# Patient Record
Sex: Male | Born: 1935 | Race: White | Hispanic: No | Smoking: Former smoker
Health system: Southern US, Community
[De-identification: ages and names within clinical notes are randomized; demographics above are authoritative.]

## PROBLEM LIST (undated history)

## (undated) DIAGNOSIS — H409 Unspecified glaucoma: Secondary | ICD-10-CM

## (undated) DIAGNOSIS — K5730 Diverticulosis of large intestine without perforation or abscess without bleeding: Secondary | ICD-10-CM

## (undated) DIAGNOSIS — H269 Unspecified cataract: Secondary | ICD-10-CM

## (undated) DIAGNOSIS — Z87438 Personal history of other diseases of male genital organs: Secondary | ICD-10-CM

## (undated) DIAGNOSIS — N400 Enlarged prostate without lower urinary tract symptoms: Secondary | ICD-10-CM

## (undated) DIAGNOSIS — M109 Gout, unspecified: Secondary | ICD-10-CM

## (undated) DIAGNOSIS — N289 Disorder of kidney and ureter, unspecified: Secondary | ICD-10-CM

## (undated) DIAGNOSIS — B029 Zoster without complications: Secondary | ICD-10-CM

## (undated) DIAGNOSIS — F329 Major depressive disorder, single episode, unspecified: Secondary | ICD-10-CM

## (undated) DIAGNOSIS — D649 Anemia, unspecified: Secondary | ICD-10-CM

## (undated) DIAGNOSIS — E039 Hypothyroidism, unspecified: Secondary | ICD-10-CM

## (undated) DIAGNOSIS — L409 Psoriasis, unspecified: Secondary | ICD-10-CM

## (undated) DIAGNOSIS — F32A Depression, unspecified: Secondary | ICD-10-CM

## (undated) DIAGNOSIS — E119 Type 2 diabetes mellitus without complications: Secondary | ICD-10-CM

## (undated) DIAGNOSIS — I10 Essential (primary) hypertension: Secondary | ICD-10-CM

## (undated) HISTORY — PX: SINUS IRRIGATION: SHX2411

## (undated) HISTORY — DX: Depression, unspecified: F32.A

## (undated) HISTORY — DX: Type 2 diabetes mellitus without complications: E11.9

## (undated) HISTORY — PX: OTHER SURGICAL HISTORY: SHX169

## (undated) HISTORY — DX: Diverticulosis of large intestine without perforation or abscess without bleeding: K57.30

## (undated) HISTORY — DX: Gout, unspecified: M10.9

## (undated) HISTORY — DX: Essential (primary) hypertension: I10

## (undated) HISTORY — DX: Unspecified glaucoma: H40.9

## (undated) HISTORY — DX: Major depressive disorder, single episode, unspecified: F32.9

## (undated) HISTORY — DX: Hypothyroidism, unspecified: E03.9

---

## 1970-02-28 HISTORY — PX: NEPHROLITHOTOMY: SUR881

## 2007-10-26 ENCOUNTER — Ambulatory Visit: Payer: Self-pay | Admitting: Internal Medicine

## 2007-12-21 ENCOUNTER — Inpatient Hospital Stay: Payer: Self-pay | Admitting: Internal Medicine

## 2010-03-30 ENCOUNTER — Ambulatory Visit: Payer: Self-pay | Admitting: Internal Medicine

## 2010-03-31 ENCOUNTER — Ambulatory Visit: Payer: Self-pay | Admitting: Internal Medicine

## 2011-06-16 ENCOUNTER — Emergency Department: Payer: Self-pay | Admitting: Emergency Medicine

## 2011-06-27 ENCOUNTER — Ambulatory Visit: Payer: Self-pay | Admitting: Ophthalmology

## 2011-06-27 LAB — CREATININE, SERUM
Creatinine: 1.3 mg/dL (ref 0.60–1.30)
EGFR (Non-African Amer.): 53 — ABNORMAL LOW

## 2011-08-02 ENCOUNTER — Emergency Department: Payer: Self-pay | Admitting: Unknown Physician Specialty

## 2011-08-02 LAB — COMPREHENSIVE METABOLIC PANEL
Albumin: 3.3 g/dL — ABNORMAL LOW (ref 3.4–5.0)
Anion Gap: 9 (ref 7–16)
Bilirubin,Total: 0.3 mg/dL (ref 0.2–1.0)
Calcium, Total: 8.2 mg/dL — ABNORMAL LOW (ref 8.5–10.1)
Chloride: 105 mmol/L (ref 98–107)
EGFR (African American): >60
EGFR (Non-African Amer.): 60 — ABNORMAL LOW
Osmolality: 283 (ref 275–301)
SGOT(AST): 18 U/L (ref 15–37)
SGPT (ALT): 15 U/L
Sodium: 140 mmol/L (ref 136–145)

## 2011-08-02 LAB — URINALYSIS, COMPLETE
Blood: NEGATIVE
Glucose,UR: NEGATIVE mg/dL (ref 0–75)
Ketone: NEGATIVE
Nitrite: NEGATIVE
Ph: 7 (ref 4.5–8.0)
Protein: 100
RBC,UR: 1 /HPF (ref 0–5)
Specific Gravity: 1.008 (ref 1.003–1.030)
Squamous Epithelial: 1
WBC UR: 2 /HPF (ref 0–5)

## 2011-08-02 LAB — CBC
MCHC: 33.6 g/dL (ref 32.0–36.0)
Platelet: 206 10*3/uL (ref 150–440)
RDW: 13.3 % (ref 11.5–14.5)

## 2011-08-16 ENCOUNTER — Ambulatory Visit: Payer: Self-pay | Admitting: Unknown Physician Specialty

## 2011-08-18 LAB — PATHOLOGY REPORT

## 2012-06-14 ENCOUNTER — Ambulatory Visit: Payer: Self-pay | Admitting: Family Medicine

## 2013-12-13 ENCOUNTER — Ambulatory Visit: Payer: Self-pay | Admitting: Urology

## 2014-08-06 DIAGNOSIS — E119 Type 2 diabetes mellitus without complications: Secondary | ICD-10-CM | POA: Insufficient documentation

## 2015-01-29 ENCOUNTER — Other Ambulatory Visit: Payer: Self-pay | Admitting: Nephrology

## 2015-01-29 DIAGNOSIS — R10A2 Flank pain, left side: Secondary | ICD-10-CM

## 2015-01-29 DIAGNOSIS — R109 Unspecified abdominal pain: Secondary | ICD-10-CM

## 2015-02-02 ENCOUNTER — Ambulatory Visit
Admission: RE | Admit: 2015-02-02 | Discharge: 2015-02-02 | Disposition: A | Discharge status: Home / Self Care | Payer: Commercial Managed Care - HMO | Source: Ambulatory Visit | Attending: Nephrology | Admitting: Nephrology

## 2015-02-02 DIAGNOSIS — R109 Unspecified abdominal pain: Secondary | ICD-10-CM | POA: Insufficient documentation

## 2015-02-02 DIAGNOSIS — N200 Calculus of kidney: Secondary | ICD-10-CM | POA: Insufficient documentation

## 2015-02-02 DIAGNOSIS — K5730 Diverticulosis of large intestine without perforation or abscess without bleeding: Secondary | ICD-10-CM | POA: Insufficient documentation

## 2015-02-02 DIAGNOSIS — I700 Atherosclerosis of aorta: Secondary | ICD-10-CM | POA: Diagnosis not present

## 2015-02-02 DIAGNOSIS — N281 Cyst of kidney, acquired: Secondary | ICD-10-CM | POA: Insufficient documentation

## 2015-02-13 ENCOUNTER — Other Ambulatory Visit: Payer: Self-pay | Admitting: Nephrology

## 2015-02-13 DIAGNOSIS — N281 Cyst of kidney, acquired: Secondary | ICD-10-CM

## 2015-02-16 ENCOUNTER — Other Ambulatory Visit: Payer: Self-pay

## 2015-02-16 ENCOUNTER — Encounter: Payer: Self-pay | Admitting: Urology

## 2015-02-16 ENCOUNTER — Ambulatory Visit (INDEPENDENT_AMBULATORY_CARE_PROVIDER_SITE_OTHER): Payer: Commercial Managed Care - HMO | Admitting: Urology

## 2015-02-16 VITALS — BP 197/73 | HR 77 | Ht 66.0 in | Wt 238.2 lb

## 2015-02-16 DIAGNOSIS — Q6100 Congenital renal cyst, unspecified: Secondary | ICD-10-CM

## 2015-02-16 DIAGNOSIS — E039 Hypothyroidism, unspecified: Secondary | ICD-10-CM | POA: Insufficient documentation

## 2015-02-16 DIAGNOSIS — I10 Essential (primary) hypertension: Secondary | ICD-10-CM

## 2015-02-16 DIAGNOSIS — N400 Enlarged prostate without lower urinary tract symptoms: Secondary | ICD-10-CM | POA: Diagnosis not present

## 2015-02-16 DIAGNOSIS — D649 Anemia, unspecified: Secondary | ICD-10-CM | POA: Insufficient documentation

## 2015-02-16 DIAGNOSIS — N200 Calculus of kidney: Secondary | ICD-10-CM

## 2015-02-16 DIAGNOSIS — L409 Psoriasis, unspecified: Secondary | ICD-10-CM | POA: Insufficient documentation

## 2015-02-16 DIAGNOSIS — E119 Type 2 diabetes mellitus without complications: Secondary | ICD-10-CM

## 2015-02-16 DIAGNOSIS — K5730 Diverticulosis of large intestine without perforation or abscess without bleeding: Secondary | ICD-10-CM

## 2015-02-16 DIAGNOSIS — M109 Gout, unspecified: Secondary | ICD-10-CM | POA: Insufficient documentation

## 2015-02-16 DIAGNOSIS — N281 Cyst of kidney, acquired: Secondary | ICD-10-CM

## 2015-02-16 DIAGNOSIS — R3129 Other microscopic hematuria: Secondary | ICD-10-CM

## 2015-02-16 DIAGNOSIS — H269 Unspecified cataract: Secondary | ICD-10-CM | POA: Insufficient documentation

## 2015-02-16 HISTORY — DX: Type 2 diabetes mellitus without complications: E11.9

## 2015-02-16 HISTORY — DX: Hypothyroidism, unspecified: E03.9

## 2015-02-16 HISTORY — DX: Essential (primary) hypertension: I10

## 2015-02-16 HISTORY — DX: Diverticulosis of large intestine without perforation or abscess without bleeding: K57.30

## 2015-02-16 LAB — URINALYSIS, COMPLETE
Bilirubin, UA: NEGATIVE
GLUCOSE, UA: NEGATIVE
Ketones, UA: NEGATIVE
LEUKOCYTES UA: NEGATIVE
Nitrite, UA: NEGATIVE
PH UA: 5.5 (ref 5.0–7.5)
Specific Gravity, UA: 1.02 (ref 1.005–1.030)
Urobilinogen, Ur: 0.2 mg/dL (ref 0.2–1.0)

## 2015-02-16 LAB — MICROSCOPIC EXAMINATION: RBC MICROSCOPIC, UA: NONE SEEN /HPF (ref 0–?)

## 2015-02-16 NOTE — Progress Notes (Signed)
Consultation for right renal stone for by Dr. Thedore MinsSingh. PCP Dr. Burnadette PopLinthavong.   #1 right renal stone- patient underwent CT scan of the abdomen and pelvis December 2016 in evaluation for proteinuria which revealed a punctate right renal stone. The left kidney was clear, there was a 9 cm left lower pole cyst. The bladder appeared normal. The prostate was small. There were some prostatic calcifications.Patient has a history of a left open nephrolithotomy about 50 years ago.  #2 left renal cyst-patient with a 9 cm left lower pole renal cyst.he had some left flank pain. As seen below he had microscopic hematuria on October 2016 UA and follow-up UA was clear.the pain is just above his left hip. It is intermittent. Nothing seems to make it better or worse. No associated signs or symptoms.the patient is scheduled for cyst aspiration of the left lower pole cyst and 3 days by IR. Dr. Thedore MinsSingh set this up.   #3 prostate cancer screening- October 2016 PSA 1.13. Again prostate normal size on CT.  #4 BPH-patient has a history of what sounds like a first generation greenlight laser and Dr. Terrance MassWolf's office 6 or 7 years ago.patient continues tamsulosin. He denies lower urinary tract symptoms at the current time.   #5 microscopic hematuria- patient urinalysis October 2016 showed 4-10 red blood cells per high power field. November 2016 UA was clear. Repeat urinalysis at Dr. Doristine ChurchSingh's office also negative for microhematuria.  UA today clear.   Today, [name] is seen for the above.   I reviewed his past medical and surgical history, social history , family history , medications and allergies.  14 system review of systems was obtained which was negative except for the following:constipation  Physical exam: No acute distress No CVA tenderness Abdomen soft and nontender, no inguinal hernias GU: Penis uncircumcised. Foreskin of penis without lesion. Testicles descended bilaterally and palpably normal. On digital rectal exam  the prostate was mildly enlarged but without hard area or nodule.  Assessment/plan: #1 renal cyst-in my experience these are typically not the source of pain and other imaging might need to be done to check the back. However, aspiration is are ready set and it will be interesting to see if it improves his pain. We did discuss that the cyst typically recur after aspiration and aspiration does risk of bleeding and infection.  #2 microscopic hematuria-patient had microhematuria on 1 UA 2 months ago. I did recommend cystoscopy and he will follow-up for this. We discussed the nature risks and benefits of cystoscopy.  #3 right renal stone-punctate-continue surveillance  #4 BPH-continue tamsulosin. Patient with a normal DRE today.

## 2015-02-18 ENCOUNTER — Other Ambulatory Visit: Payer: Self-pay | Admitting: Radiology

## 2015-02-19 ENCOUNTER — Ambulatory Visit
Admission: RE | Admit: 2015-02-19 | Discharge: 2015-02-19 | Disposition: A | Discharge status: Home / Self Care | Payer: Commercial Managed Care - HMO | Source: Ambulatory Visit | Attending: Nephrology | Admitting: Nephrology

## 2015-02-19 DIAGNOSIS — R109 Unspecified abdominal pain: Secondary | ICD-10-CM | POA: Insufficient documentation

## 2015-02-19 DIAGNOSIS — N281 Cyst of kidney, acquired: Secondary | ICD-10-CM | POA: Diagnosis present

## 2015-02-19 LAB — BASIC METABOLIC PANEL
Anion gap: 3 — ABNORMAL LOW (ref 5–15)
BUN: 25 mg/dL — AB (ref 6–20)
CHLORIDE: 102 mmol/L (ref 101–111)
CO2: 26 mmol/L (ref 22–32)
CREATININE: 1.29 mg/dL — AB (ref 0.61–1.24)
Calcium: 8.4 mg/dL — ABNORMAL LOW (ref 8.9–10.3)
GFR, EST AFRICAN AMERICAN: 58 mL/min — AB (ref >60)
GFR, EST NON AFRICAN AMERICAN: 50 mL/min — AB (ref >60)
Glucose, Bld: 196 mg/dL — ABNORMAL HIGH (ref 65–99)
POTASSIUM: 4.7 mmol/L (ref 3.5–5.1)
SODIUM: 131 mmol/L — AB (ref 135–145)

## 2015-02-19 LAB — CBC WITH DIFFERENTIAL/PLATELET
BASOS ABS: 0 10*3/uL (ref 0–0.1)
Basophils Relative: 1 %
EOS ABS: 0.1 10*3/uL (ref 0–0.7)
EOS PCT: 2 %
HCT: 38.1 % — ABNORMAL LOW (ref 40.0–52.0)
HEMOGLOBIN: 12.4 g/dL — AB (ref 13.0–18.0)
LYMPHS ABS: 2 10*3/uL (ref 1.0–3.6)
LYMPHS PCT: 31 %
MCH: 30.9 pg (ref 26.0–34.0)
MCHC: 32.5 g/dL (ref 32.0–36.0)
MCV: 95 fL (ref 80.0–100.0)
Monocytes Absolute: 0.4 10*3/uL (ref 0.2–1.0)
Monocytes Relative: 6 %
NEUTROS PCT: 60 %
Neutro Abs: 3.8 10*3/uL (ref 1.4–6.5)
PLATELETS: 199 10*3/uL (ref 150–440)
RBC: 4.01 MIL/uL — AB (ref 4.40–5.90)
RDW: 13.5 % (ref 11.5–14.5)
WBC: 6.3 10*3/uL (ref 3.8–10.6)

## 2015-02-19 LAB — GLUCOSE, CAPILLARY: Glucose-Capillary: 160 mg/dL — ABNORMAL HIGH (ref 65–99)

## 2015-02-19 LAB — PROTIME-INR
INR: 1.01
Prothrombin Time: 13.5 seconds (ref 11.4–15.0)

## 2015-02-19 MED ORDER — FENTANYL CITRATE (PF) 100 MCG/2ML IJ SOLN
INTRAMUSCULAR | Status: AC | PRN
  Administered 2015-02-19: 50 ug via INTRAVENOUS

## 2015-02-19 MED ORDER — SODIUM CHLORIDE 0.9 % IV SOLN
INTRAVENOUS | Status: DC
Start: 2015-02-19 — End: 2015-02-20
  Administered 2015-02-19: 14:00:00 via INTRAVENOUS

## 2015-02-19 MED ORDER — FENTANYL CITRATE (PF) 100 MCG/2ML IJ SOLN
INTRAMUSCULAR | Status: AC
  Filled 2015-02-19: qty 4

## 2015-02-19 NOTE — Procedures (Signed)
Successful US aspiration of the large left renal cyst 225 cc dark bloddy cyst fld aspirated Cytology sent No comp Full report in PACS

## 2015-02-24 LAB — CYTOLOGY - NON PAP

## 2015-03-09 ENCOUNTER — Other Ambulatory Visit: Payer: Commercial Managed Care - HMO

## 2015-03-10 ENCOUNTER — Ambulatory Visit (INDEPENDENT_AMBULATORY_CARE_PROVIDER_SITE_OTHER): Payer: PPO | Admitting: Urology

## 2015-03-10 VITALS — BP 182/74 | HR 79 | Ht 65.0 in | Wt 236.2 lb

## 2015-03-10 DIAGNOSIS — R3129 Other microscopic hematuria: Secondary | ICD-10-CM

## 2015-03-10 LAB — MICROSCOPIC EXAMINATION: RBC, UA: NONE SEEN /hpf (ref 0–?)

## 2015-03-10 LAB — URINALYSIS, COMPLETE
BILIRUBIN UA: NEGATIVE
Glucose, UA: NEGATIVE
Ketones, UA: NEGATIVE
Leukocytes, UA: NEGATIVE
NITRITE UA: NEGATIVE
RBC UA: NEGATIVE
SPEC GRAV UA: 1.015 (ref 1.005–1.030)
Urobilinogen, Ur: 0.2 mg/dL (ref 0.2–1.0)
pH, UA: 7 (ref 5.0–7.5)

## 2015-03-10 NOTE — Progress Notes (Signed)
82 year old male who presents today for  Follow-up. he has several urologic issues.  #1 right renal stone- patient underwent CT scan of the abdomen and pelvis December 2016 in evaluation for proteinuria which revealed a punctate right renal stone. The left kidney was clear, there was a 9 cm left lower pole cyst. The bladder appeared normal. The prostate was small. There were some prostatic calcifications.Patient has a history of a left open nephrolithotomy about 50 years ago.  #2 left renal cyst-patient with a 9 cm left lower pole renal cyst.he had some left flank pain. As seen below he had microscopic hematuria on October 2016 UA and follow-up UA was clear.the pain is just above his left hip. It is intermittent. Nothing seems to make it better or worse.  #3 prostate cancer screening- October 2016 PSA 1.13. Again prostate normal size on CT.  #4 BPH-patient has a history of what sounds like a first generation greenlight laser and Dr. Terrance Mass office 6 or 7 years ago.patient continues tamsulosin. He denies lower urinary tract symptoms at the current time.  #5 microscopic hematuria- patient urinalysis October 2016 showed 4-10 red blood cells per high power field. November 2016 UA was clear. Repeat urinalysis at Dr. Doristine Church office also negative for microhematuria.   interval: the patient presents today with complaints of left flank pain , although he is scheduled for cystoscopy for completion of his microscopic hematuria evaluation. The patient is not interested in cystoscopy today. Instead, the patient would like to discuss his left flank pain. He initially had some left flank discomfort , feeling like he had a knot it is left low back. This was exacerbated with palpation. It appeared to be intermittent. The patient subsequently underwent left lower pole cyst aspiration 2-1/2 weeks ago. Since then, the patient has had significantly worsening pain. The pain is a constant dull ache. It seems to be better  with some slight pressure like a pillow. He has been taking Tylenol around-the-clock to alleviate the symptoms. He has not had any gross hematuria. He has not had any fevers or chills. He has not had any follow-up following his aspiration with the physician that ordered it or the physician that is performed the operation.  Current Outpatient Prescriptions on File Prior to Visit  Medication Sig Dispense Refill  . allopurinol (ZYLOPRIM) 300 MG tablet     . amLODipine (NORVASC) 5 MG tablet TAKE ONE TABLET EVERY DAY    . B Complex Vitamins (VITAMIN-B COMPLEX) TABS Take by mouth.    Marland Kitchen HUMALOG KWIKPEN 100 UNIT/ML KiwkPen     . LANTUS SOLOSTAR 100 UNIT/ML Solostar Pen     . levothyroxine (SYNTHROID, LEVOTHROID) 50 MCG tablet     . lovastatin (MEVACOR) 20 MG tablet     . meloxicam (MOBIC) 15 MG tablet     . pantoprazole (PROTONIX) 40 MG tablet     . quinapril (ACCUPRIL) 20 MG tablet     . tamsulosin (FLOMAX) 0.4 MG CAPS capsule      No current facility-administered medications on file prior to visit.   PE: NAD Filed Vitals:   03/10/15 1150  BP: 182/74  Pulse: 79  Abdomen is soft Left flank is tender to palpation.  The tenderness is more lateral and lower than his CVA. It is located just above his posterior iliac crest.  The area appears to be almost mass like, which I was able to grab completely, although he does have excess skin/fat in that area.   The puncture site  appears to be healing well. There is no cellulitis or induration in this area.   urinalysis:  No evidence of infection or microscopic hematuria  cytology from renal cyst aspiration: inconclusive, rare groups of mildly atypical epithelial cells.  Impression: The patient's left flank pain to me appears to be a muscular issue. However, I do think it's prudent to obtain a renal ultrasound to ensure the patient does not have a urinoma or perinephric hematoma.  Recommendation:  I suggested that the patient try low-dose ibuprofen  several times a day with diathermy directly to the sore area. In particular, I explained to the patient should use heat followed by deep tissue massage or stretching of the muscle and then ibuprofen and ice. I also ordered a renal ultrasound and will follow the patient after the test to go over the results and evaluate his progress/pain.  As it relates to the patient's hematuria, the patient had a full evaluation for this in 2015 by Dr. Artis FlockWolfe which was negative for any sorts of bladder pathology. The patient did have what appears to be a microwave or cool thermotherapy bladder outlet procedure 1 year ago for which his symptoms have not changed. He may have some prostatic bleeding. However over the past several months he has not had any ongoing microscopic hematuria including today's UA. As such, I recommended that the patient follow-up with us for cystoscopy if he develops gross hematuria again.

## 2015-03-24 ENCOUNTER — Ambulatory Visit
Admission: RE | Admit: 2015-03-24 | Discharge: 2015-03-24 | Disposition: A | Discharge status: Home / Self Care | Payer: PPO | Source: Ambulatory Visit | Attending: Urology | Admitting: Urology

## 2015-03-24 ENCOUNTER — Ambulatory Visit (INDEPENDENT_AMBULATORY_CARE_PROVIDER_SITE_OTHER): Payer: PPO | Admitting: Urology

## 2015-03-24 VITALS — BP 173/75 | HR 69 | Ht 65.0 in | Wt 239.0 lb

## 2015-03-24 DIAGNOSIS — N281 Cyst of kidney, acquired: Secondary | ICD-10-CM

## 2015-03-24 DIAGNOSIS — R3129 Other microscopic hematuria: Secondary | ICD-10-CM | POA: Insufficient documentation

## 2015-03-24 DIAGNOSIS — N289 Disorder of kidney and ureter, unspecified: Secondary | ICD-10-CM | POA: Insufficient documentation

## 2015-03-24 LAB — MICROSCOPIC EXAMINATION
Bacteria, UA: NONE SEEN
RBC, UA: NONE SEEN /hpf (ref 0–?)

## 2015-03-24 LAB — URINALYSIS, COMPLETE
BILIRUBIN UA: NEGATIVE
Glucose, UA: NEGATIVE
Ketones, UA: NEGATIVE
Leukocytes, UA: NEGATIVE
Nitrite, UA: NEGATIVE
PH UA: 8.5 — AB (ref 5.0–7.5)
RBC UA: NEGATIVE
Specific Gravity, UA: 1.015 (ref 1.005–1.030)
Urobilinogen, Ur: 0.2 mg/dL (ref 0.2–1.0)

## 2015-03-24 MED ORDER — TRAMADOL HCL 50 MG PO TABS: 50.0000 mg | ORAL_TABLET | Freq: Four times a day (QID) | ORAL | Status: DC | PRN

## 2015-03-24 NOTE — Progress Notes (Signed)
82 year old male who presents today for  Follow-up. he has several urologic issues.  #1 right renal stone- patient underwent CT scan of the abdomen and pelvis December 2016 in evaluation for proteinuria which revealed a punctate right renal stone. The left kidney was clear, there was a 9 cm left lower pole cyst. The bladder appeared normal. The prostate was small. There were some prostatic calcifications.Patient has a history of a left open nephrolithotomy about 50 years ago.  #2 left renal cyst-patient with a 9 cm left lower pole renal cyst.he had some left flank pain. As seen below he had microscopic hematuria on October 2016 UA and follow-up UA was clear.the pain is just above his left hip. It is intermittent. Nothing seems to make it better or worse.  #3 prostate cancer screening- October 2016 PSA 1.13. Again prostate normal size on CT.  #4 BPH-patient has a history of what sounds like a first generation greenlight laser and Dr. Terrance Mass office 6 or 7 years ago.patient continues tamsulosin. He denies lower urinary tract symptoms at the current time.  #5 microscopic hematuria- patient urinalysis October 2016 showed 4-10 red blood cells per high power field. November 2016 UA was clear. Repeat urinalysis at Dr. Doristine Church office also negative for microhematuria.  Interval: The patient underwent a left renal cyst aspiration about a month ago. This was for left lower back pain. Since the aspiration the pain has significantly worsened. I saw him 2 weeks ago, we ordered a renal ultrasound, and I suggested that he try ibuprofen and diathermy for his pain. The ultrasound has not been completed unfortunately. The patient states that he's been on round-the-clock ibuprofen and has been doing the diathermy. His pain is only slightly improved. He denies any gross hematuria or dysuria. He has not had any fevers or chills. He denies any weakness or neurological changes   Current Outpatient Prescriptions on File  Prior to Visit  Medication Sig Dispense Refill  . allopurinol (ZYLOPRIM) 300 MG tablet     . amLODipine (NORVASC) 5 MG tablet TAKE ONE TABLET EVERY DAY    . B Complex Vitamins (VITAMIN-B COMPLEX) TABS Take by mouth.    Marland Kitchen HUMALOG KWIKPEN 100 UNIT/ML KiwkPen     . LANTUS SOLOSTAR 100 UNIT/ML Solostar Pen     . lovastatin (MEVACOR) 20 MG tablet     . meloxicam (MOBIC) 15 MG tablet     . pantoprazole (PROTONIX) 40 MG tablet     . quinapril (ACCUPRIL) 20 MG tablet     . tamsulosin (FLOMAX) 0.4 MG CAPS capsule Reported on 03/24/2015    . levothyroxine (SYNTHROID, LEVOTHROID) 50 MCG tablet Reported on 03/24/2015     No current facility-administered medications on file prior to visit.   PE: NAD Filed Vitals:   03/24/15 0944  BP: 173/75  Pulse: 69  Abdomen is soft Left flank is tender to palpation.  The tenderness is more lateral and lower than his CVA. It is located just above his posterior iliac crest.  The area appears to be almost mass like, which I was able to grab completely, although he does have excess skin/fat in that area.   The puncture site appears to be healing well. There is no cellulitis or induration in this area.   urinalysis:  No evidence of infection or microscopic hematuria  cytology from renal cyst aspiration: inconclusive, rare groups of mildly atypical epithelial cells.  Impression: Again, I suspect the patient has developed a hematoma after aspirating the cyst. This will  take some time to resolve. I would still like to have a renal ultrasound to ensure that there is nothing more significant going on. I did talk to the patient about the fact that once the hematoma resolves his pain would likely remain as I did not think that this was the source of his pain to begin with.  Recommendation: Renal ultrasound, follow-up in 2 weeks.  I also sent the patient some tramadol so that he could stop taking ibuprofen. I worry about him developing gastritis from too much NSAIDs.   As it  relates to the patient's hematuria, the patient had a full evaluation for this in 2015 by Dr. Artis Flock which was negative for any sorts of bladder pathology. The patient did have what appears to be a microwave or cool thermotherapy bladder outlet procedure 1 year ago for which his symptoms have not changed. He may have some prostatic bleeding. However over the past several months he has not had any ongoing microscopic hematuria including today's UA. As such, I recommended that the patient follow-up with [place] for cystoscopy if he develops gross hematuria again.

## 2015-03-26 ENCOUNTER — Ambulatory Visit (INDEPENDENT_AMBULATORY_CARE_PROVIDER_SITE_OTHER): Payer: PPO | Admitting: Urology

## 2015-03-26 ENCOUNTER — Encounter: Payer: Self-pay | Admitting: Urology

## 2015-03-26 VITALS — BP 165/83 | HR 74 | Ht 65.0 in | Wt 241.1 lb

## 2015-03-26 DIAGNOSIS — M549 Dorsalgia, unspecified: Secondary | ICD-10-CM

## 2015-03-26 DIAGNOSIS — Q6100 Congenital renal cyst, unspecified: Secondary | ICD-10-CM

## 2015-03-26 DIAGNOSIS — R109 Unspecified abdominal pain: Secondary | ICD-10-CM

## 2015-03-26 DIAGNOSIS — N281 Cyst of kidney, acquired: Secondary | ICD-10-CM

## 2015-03-26 MED ORDER — OXYCODONE-ACETAMINOPHEN 10-325 MG PO TABS: 1.0000 | ORAL_TABLET | ORAL | Status: DC | PRN

## 2015-03-26 NOTE — Progress Notes (Signed)
03/26/2015 5:40 PM   Merri Ray [birth year] [id number]  Referring provider: Marisue Ivan, MD [phone] [facility] [address] [facility] [place], [place] [address]  Chief Complaint  Patient presents with  . Medication Problem    Pain med wants to change    HPI: Patient is an 82 year old Caucasian male who is status post left renal cyst aspiration approximally one month ago for left lower back pain.  He is finding his current pain medicine, tramadol, is giving him fatigue.  He also has been taking ibuprofen and using heating pads for the pain.  He states he noted no improvement in the left lower back pain after the aspiration of the renal cyst.  The cyst was approximately 9 cm prior to the aspiration. Repeat ultrasound performed on 03/23/2005 noted the cyst was now approximately 8 cm in size.  He has not noted any thing that seems to make the pain worse. He has not found relief with the pain using ibuprofen or tramadol.  He is not having fevers, chills, nausea or vomiting with the pain.  Since his last visit with [place] 2 days ago, he is also developed left lower quadrant pain.  He states it feels reminiscence of ulcers he's had in the past.  He states he is experiencing pain after eating.  Is not noted blood in the stool, dark tarry stools or hematocrit emesis.    PMH: Past Medical History  Diagnosis Date  . Essential (primary) hypertension 02/16/2015  . Diverticular disease of large intestine 02/16/2015  . Acquired hypothyroidism 02/16/2015  . Diabetes mellitus (HCC) 02/16/2015    Overview:  insulin 2005 last eye exam 01/2013 [facility]   . Depression   . Glaucoma   . Gout     Surgical History: Past Surgical History  Procedure Laterality Date  . Nephrolithotomy  1972  . Sinus irrigation      Home Medications:    Medication List       This list is accurate as of: 03/26/15 11:59 PM.  Always use your most recent med list.               allopurinol 300 MG  tablet  Commonly known as:  ZYLOPRIM     amLODipine 5 MG tablet  Commonly known as:  NORVASC  TAKE ONE TABLET EVERY DAY     HUMALOG KWIKPEN 100 UNIT/ML KiwkPen  Generic drug:  insulin lispro  Reported on 03/26/2015     ibuprofen 200 MG tablet  Commonly known as:  ADVIL,MOTRIN  Take 200 mg by mouth every 6 (six) hours as needed. Reported on 03/26/2015     insulin aspart protamine- aspart (70-30) 100 UNIT/ML injection  Commonly known as:  NOVOLOG MIX 70/30  Inject 10 Units into the skin 2 (two) times daily.     LANTUS SOLOSTAR 100 UNIT/ML Solostar Pen  Generic drug:  Insulin Glargine     levothyroxine 50 MCG tablet  Commonly known as:  SYNTHROID, LEVOTHROID  Reported on 03/24/2015     lovastatin 20 MG tablet  Commonly known as:  MEVACOR     meloxicam 15 MG tablet  Commonly known as:  MOBIC  Reported on 03/26/2015     oxyCODONE-acetaminophen 10-325 MG tablet  Commonly known as:  PERCOCET  Take 1 tablet by mouth every 4 (four) hours as needed for pain.     pantoprazole 40 MG tablet  Commonly known as:  PROTONIX     quinapril 20 MG tablet  Commonly known as:  ACCUPRIL     tamsulosin 0.4 MG Caps capsule  Commonly known as:  FLOMAX  Reported on 03/24/2015     traMADol 50 MG tablet  Commonly known as:  ULTRAM  Take 1-2 tablets (50-100 mg total) by mouth every 6 (six) hours as needed for moderate pain.     Vitamin-B Complex Tabs  Take by mouth.        Allergies:  Allergies  Allergen Reactions  . Aspirin Other (See Comments)    Constipation  . Metformin Nausea Only and Nausea And Vomiting  . Pioglitazone Other (See Comments)    Left-sided weakness  . Rosiglitazone Other (See Comments)    Left-sided weakness    Family History: Family History  Problem Relation Age of Onset  . Hematuria Neg Hx   . Kidney cancer Neg Hx   . Prostate cancer Neg Hx   . Sickle cell anemia Neg Hx   . Tuberculosis Neg Hx     Social History:  reports that he has never smoked. His  smokeless tobacco use includes Chew. He reports that he does not drink alcohol or use illicit drugs.  ROS: UROLOGY Frequent Urination?: Yes Hard to postpone urination?: No Burning/pain with urination?: No Get up at night to urinate?: Yes Leakage of urine?: Yes Urine stream starts and stops?: Yes Trouble starting stream?: No Do you have to strain to urinate?: No Blood in urine?: No Urinary tract infection?: Yes Sexually transmitted disease?: No Injury to kidneys or bladder?: No Painful intercourse?: No Weak stream?: No Erection problems?: Yes Penile pain?: No  Gastrointestinal Nausea?: No Vomiting?: No Indigestion/heartburn?: No Diarrhea?: No Constipation?: Yes  Constitutional Fever: No Night sweats?: No Weight loss?: No Fatigue?: No  Skin Skin rash/lesions?: No Itching?: No  Eyes Blurred vision?: No Double vision?: No  Ears/Nose/Throat Sore throat?: No Sinus problems?: No  Hematologic/Lymphatic Swollen glands?: No Easy bruising?: No  Cardiovascular Leg swelling?: No Chest pain?: No  Respiratory Cough?: No Shortness of breath?: No  Endocrine Excessive thirst?: No  Musculoskeletal Back pain?: Yes Joint pain?: No  Neurological Headaches?: No Dizziness?: No  Psychologic Depression?: No Anxiety?: No  Physical Exam: BP 165/83 mmHg  Pulse 74  Ht  (1.651 m)  Wt 241 lb 1.6 oz (109.362 kg)  BMI 40.12 kg/m2  Constitutional: Well nourished. Alert and oriented, No acute distress. HEENT: [place] AT, moist mucus membranes. Trachea midline, no masses. Cardiovascular: No clubbing, cyanosis, or edema. Respiratory: Normal respiratory effort, no increased work of breathing. GI: Abdomen is soft, mildly tender in the left lower quadrant, non distended, no abdominal masses. Liver and spleen not palpable.  No hernias appreciated.  Stool sample for occult testing is not indicated.   GU: No CVA tenderness.  No bladder fullness or masses.  He is tender in  the left lower back.   Skin: No rashes, bruises or suspicious lesions. Lymph: No cervical or inguinal adenopathy. Neurologic: Grossly intact, no focal deficits, moving all 4 extremities. Psychiatric: Normal mood and affect.  Laboratory Data: Lab Results  Component Value Date   WBC 9.0 03/27/2015   HGB 11.9* 03/27/2015   HCT 36.7* 03/27/2015   MCV 97.0 03/27/2015   PLT 228 03/27/2015    Lab Results  Component Value Date   CREATININE 1.74* 03/27/2015    Lab Results  Component Value Date   AST 19 03/27/2015   Lab Results  Component Value Date   ALT 13* 03/27/2015    Urinalysis Results for orders placed  or performed in visit on 03/24/15  Microscopic Examination  Result Value Ref Range   WBC, UA 0-5 0 -  5 /hpf   RBC, UA None seen 0 -  2 /hpf   Epithelial Cells (non renal) 0-10 0 - 10 /hpf   Mucus, UA Present (A) Not Estab.   Bacteria, UA None seen None seen/Few  Urinalysis, Complete  Result Value Ref Range   Specific Gravity, UA 1.015 1.005 - 1.030   pH, UA 8.5 (H) 5.0 - 7.5   Color, UA Yellow Yellow   Appearance Ur Clear Clear   Leukocytes, UA Negative Negative   Protein, UA 2+ (A) Negative/Trace   Glucose, UA Negative Negative   Ketones, UA Negative Negative   RBC, UA Negative Negative   Bilirubin, UA Negative Negative   Urobilinogen, Ur 0.2 0.2 - 1.0 mg/dL   Nitrite, UA Negative Negative   Microscopic Examination See below:    Pertinent Imaging: CLINICAL DATA: Left flank pain for 2 weeks.  EXAM: CT ABDOMEN AND PELVIS WITHOUT CONTRAST  TECHNIQUE: Multidetector CT imaging of the abdomen and pelvis was performed following the standard protocol without IV contrast.  COMPARISON: CT scan of December 13, 2013.  FINDINGS: Visualized lung bases are unremarkable. No significant osseous abnormality is noted.  No gallstones are noted. No focal abnormality is noted in the liver, spleen or pancreas on these unenhanced images. Adrenal glands  appear normal. Small nonobstructive calculus is noted in lower pole collecting system of right kidney. No ureteral calculi are noted. No hydronephrosis or renal obstruction is noted 9.3 cm simple cyst is seen arising from lower pole of left kidney which is increased in size compared to prior exam. Atherosclerosis of abdominal aorta is noted without aneurysm formation. The appendix appears normal. Sigmoid diverticulosis is noted without inflammation. There is no evidence of bowel obstruction. No abnormal fluid collection is noted. Urinary bladder appears normal. Prostatic calcifications are noted. No significant adenopathy is noted.  IMPRESSION: Atherosclerosis of abdominal aorta without aneurysm formation.  Nonobstructive right renal calculus. No hydronephrosis or renal obstruction is noted.  9 cm simple cyst arising from lower pole left kidney which has increased in size compared to prior exam.  Sigmoid diverticulosis is noted without inflammation.   Electronically Signed  By: Lupita Raider, M.D.  On: 02/02/2015 08:26  CLINICAL DATA: ENLARGING LEFT INFERIOR RENAL CYST, INTERMITTENT LEFT FLANK PAIN  EXAM: ULTRASOUND GUIDED NEEDLE ASPIRATE OF THE LARGE INFERIOR LEFT RENAL CYST  MEDICATIONS: 50 mcg IV Fentanyl  Total Moderate Sedation Time: None.  PROCEDURE: The procedure, risks, benefits, and alternatives were explained to the patient. Questions regarding the procedure were encouraged and answered. The patient understands and consents to the procedure.  The left flank was prepped with ChloraPrep in a sterile fashion, and a sterile drape was applied covering the operative field. A sterile gown and sterile gloves were used for the procedure. Local anesthesia was provided with 1% Lidocaine.  Previous imaging reviewed.  Patient positioned prone. Preliminary ultrasound performed. The large inferior exophytic left renal cyst was localized.  Under sterile conditions and local anesthesia, a 17 gauge 11.8 cm access needle was advanced percutaneously under direct ultrasound into the renal cyst. Syringe aspiration yielded 220 cc hemorrhagic cyst fluid. Samples sent for cytology. Needle removed. Postprocedure imaging demonstrates complete collapse of the cyst. No immediate complication. Patient tolerated the procedure well.  COMPLICATIONS: None.  FINDINGS: Imaging confirms needle access of the left inferior renal cyst for aspiration  IMPRESSION: Successful ultrasound left  renal hemorrhagic cyst aspiration. Sample sent for cytology.   Electronically Signed  By: Judie Petit. Shick M.D.  On: 02/19/2015 15:32  CLINICAL DATA: Microscopic hematuria.  EXAM: RENAL / URINARY TRACT ULTRASOUND COMPLETE  COMPARISON: CT 02/02/2015; renal aspiration 02/19/2015  FINDINGS: Right Kidney:  Length: 11.2 cm. Echogenicity within normal limits. No mass or hydronephrosis visualized.  Left Kidney:  Length: 11.0 cm. No hydronephrosis. Normal renal cortical thickness and echogenicity. There is a 7.1 x 8.8 x 7.1 cm cystic lesion with internal debris.  Bladder:  Appears normal for degree of bladder distention.  IMPRESSION: No hydronephrosis.  Large cystic lesion with internal debris along the inferior aspect of the left kidney. This was previously aspirated 02/19/2015.   Electronically Signed  By: Annia Belt M.D.  On: 03/24/2015 13:58  Assessment & Plan:    1. Left renal cyst:   Patient is status post aspiration of the left renal cyst on 02/19/2015.  The cyst was approximally 9 cm prior to aspiration and now is approximately 8 cm.  I did explain to the patient that renal cysts can typically return after being aspirated.   I also expressed my concern that the pain he is experiencing in his left back may not be the result of the renal cyst but  may be of a muscle skeletal origin.  I stated I would like him to be  evaluated by the pain clinic before further measures are taken with his renal cyst.  He would also like another appointment with Dr. Marlou Porch to discuss surgical options for his renal cyst.  2. Left back pain:   Patient is frustrated because  tramadol makes him extremely sleepy.  Explained to the patient that feelings of tiredness or somnolence are common side effects with pain medications.  We could try a different type of pain medication, but the likelihood it will cause fatigue is pretty high.  He stated he would like to try a different pain medication. I prescribed him Percocet 12/01/2023, #30 tablets, as he stated he believes he been on this pain medication in the past without problems.  3. Left lower abdominal pain:   Patient feels that he may be developing another ulcer as the pain in the left lower quadrant is reminiscence of previous ulcers he is experienced. He has been taking ibuprofen and other pain medications, so that puts him at risk for developing an ulcer. He is not experiencing melena, hematochezia or hematemeses at this time.  I have asked him to contact his primary care physician Dr. Burnadette Pop for further evaluation of this pain.  If he should start passing blood in his stool or vomiting blood, he is to seek treatment in the emergency room immediately.  Return for appointment with one of our physicians, he prefers Dr. Marlou Porch.  These notes generated with voice recognition software. I apologize for typographical errors.  Michiel Cowboy, PA-C  [facility] [address], [address] [place], [place] [address] [phone]

## 2015-03-27 ENCOUNTER — Emergency Department: Payer: PPO

## 2015-03-27 ENCOUNTER — Emergency Department
Admission: EM | Admit: 2015-03-27 | Discharge: 2015-03-27 | Disposition: A | Discharge status: Home / Self Care | Payer: PPO | Attending: Emergency Medicine | Admitting: Emergency Medicine

## 2015-03-27 ENCOUNTER — Encounter: Payer: Self-pay | Admitting: Emergency Medicine

## 2015-03-27 DIAGNOSIS — E119 Type 2 diabetes mellitus without complications: Secondary | ICD-10-CM | POA: Diagnosis not present

## 2015-03-27 DIAGNOSIS — Z79899 Other long term (current) drug therapy: Secondary | ICD-10-CM | POA: Diagnosis not present

## 2015-03-27 DIAGNOSIS — I10 Essential (primary) hypertension: Secondary | ICD-10-CM | POA: Insufficient documentation

## 2015-03-27 DIAGNOSIS — M545 Low back pain, unspecified: Secondary | ICD-10-CM

## 2015-03-27 DIAGNOSIS — N281 Cyst of kidney, acquired: Secondary | ICD-10-CM | POA: Insufficient documentation

## 2015-03-27 DIAGNOSIS — Z794 Long term (current) use of insulin: Secondary | ICD-10-CM | POA: Insufficient documentation

## 2015-03-27 DIAGNOSIS — Z792 Long term (current) use of antibiotics: Secondary | ICD-10-CM | POA: Insufficient documentation

## 2015-03-27 DIAGNOSIS — R339 Retention of urine, unspecified: Secondary | ICD-10-CM | POA: Diagnosis present

## 2015-03-27 DIAGNOSIS — N3000 Acute cystitis without hematuria: Secondary | ICD-10-CM | POA: Insufficient documentation

## 2015-03-27 LAB — CBC
HCT: 36.7 % — ABNORMAL LOW (ref 40.0–52.0)
HEMOGLOBIN: 11.9 g/dL — AB (ref 13.0–18.0)
MCH: 31.5 pg (ref 26.0–34.0)
MCHC: 32.5 g/dL (ref 32.0–36.0)
MCV: 97 fL (ref 80.0–100.0)
Platelets: 228 10*3/uL (ref 150–440)
RBC: 3.79 MIL/uL — ABNORMAL LOW (ref 4.40–5.90)
RDW: 13.7 % (ref 11.5–14.5)
WBC: 9 10*3/uL (ref 3.8–10.6)

## 2015-03-27 LAB — COMPREHENSIVE METABOLIC PANEL
ALBUMIN: 3.6 g/dL (ref 3.5–5.0)
ALK PHOS: 121 U/L (ref 38–126)
ALT: 13 U/L — AB (ref 17–63)
ANION GAP: 8 (ref 5–15)
AST: 19 U/L (ref 15–41)
BUN: 35 mg/dL — AB (ref 6–20)
CALCIUM: 8.6 mg/dL — AB (ref 8.9–10.3)
CO2: 26 mmol/L (ref 22–32)
Chloride: 98 mmol/L — ABNORMAL LOW (ref 101–111)
Creatinine, Ser: 1.74 mg/dL — ABNORMAL HIGH (ref 0.61–1.24)
GFR calc Af Amer: 41 mL/min — ABNORMAL LOW (ref >60)
GFR calc non Af Amer: 35 mL/min — ABNORMAL LOW (ref >60)
GLUCOSE: 144 mg/dL — AB (ref 65–99)
Potassium: 5.3 mmol/L — ABNORMAL HIGH (ref 3.5–5.1)
SODIUM: 132 mmol/L — AB (ref 135–145)
Total Bilirubin: 0.5 mg/dL (ref 0.3–1.2)
Total Protein: 7.2 g/dL (ref 6.5–8.1)

## 2015-03-27 LAB — URINALYSIS COMPLETE WITH MICROSCOPIC (ARMC ONLY)
BACTERIA UA: NONE SEEN
Bilirubin Urine: NEGATIVE
Glucose, UA: NEGATIVE mg/dL
Hgb urine dipstick: NEGATIVE
Nitrite: NEGATIVE
PROTEIN: 100 mg/dL — AB
Specific Gravity, Urine: 1.013 (ref 1.005–1.030)
pH: 8 (ref 5.0–8.0)

## 2015-03-27 MED ORDER — OXYCODONE-ACETAMINOPHEN 5-325 MG PO TABS
ORAL_TABLET | ORAL | Status: AC
  Filled 2015-03-27: qty 1

## 2015-03-27 MED ORDER — OXYCODONE-ACETAMINOPHEN 5-325 MG PO TABS
1.0000 | ORAL_TABLET | Freq: Once | ORAL | Status: AC
  Administered 2015-03-27: 1 via ORAL

## 2015-03-27 MED ORDER — ONDANSETRON HCL 4 MG PO TABS: 4.0000 mg | ORAL_TABLET | Freq: Every day | ORAL | Status: DC | PRN

## 2015-03-27 MED ORDER — OXYCODONE-ACETAMINOPHEN 5-325 MG PO TABS: 1.0000 | ORAL_TABLET | Freq: Once | ORAL | Status: DC

## 2015-03-27 MED ORDER — SODIUM CHLORIDE 0.9 % IV BOLUS (SEPSIS)
500.0000 mL | Freq: Once | INTRAVENOUS | Status: AC
  Administered 2015-03-27: 500 mL via INTRAVENOUS

## 2015-03-27 MED ORDER — DEXTROSE 5 % IV SOLN
1.0000 g | Freq: Once | INTRAVENOUS | Status: AC
  Administered 2015-03-27: 1 g via INTRAVENOUS
  Filled 2015-03-27: qty 10

## 2015-03-27 MED ORDER — CEPHALEXIN 500 MG PO CAPS: 500.0000 mg | ORAL_CAPSULE | Freq: Four times a day (QID) | ORAL | Status: AC

## 2015-03-27 NOTE — ED Provider Notes (Signed)
[facility] Emergency Department Provider Note  ____________________________________________  Time seen: 1530  I have reviewed the triage vital signs and the nursing notes.  History by:  Patient and his daughter  HISTORY  Chief Complaint Urinary retention, Tremors, some difficulty with speech    HPI Juan Walker is a 82 y.o. male who presented to the emergency department because of some difficulty with speech and tremors. He initially spoke of the pain he has been having since having had a procedure on his left kidney in December. With the ongoing left back pain he has been taking pain medicine. This was increased to Percocet yesterday due to the ongoing pain. He had an ultrasound of the left kidney on Tuesday which apparently showed the pocket of fluid or abscess, unclear which, it has returned. The patient was rather descriptive and focused about the pain he has been having, but then reports that he has not had much pain today. He took 1 Percocet earlier this morning.  He reports the primary reason for the visit to the hospital is because he felt he couldn't get his stream going, that he ate urinary retention. On arrival here, an ultrasound of the bladder was performed which showed 114 mL of urine present. He has been able to void since then.    Past Medical History  Diagnosis Date  . Essential (primary) hypertension 02/16/2015  . Diverticular disease of large intestine 02/16/2015  . Acquired hypothyroidism 02/16/2015  . Diabetes mellitus (HCC) 02/16/2015    Overview:  insulin 2005 last eye exam 01/2013 [facility]   . Depression   . Glaucoma   . Gout     Patient Active Problem List   Diagnosis Date Noted  . Acquired hypothyroidism 02/16/2015  . Benign fibroma of prostate 02/16/2015  . Cataract 02/16/2015  . Chronic anemia 02/16/2015  . Diverticular disease of large intestine 02/16/2015  . Essential (primary) hypertension 02/16/2015  .  Gout 02/16/2015  . Morbid obesity (HCC) 02/16/2015  . Psoriasis 02/16/2015  . Diabetes mellitus (HCC) 02/16/2015  . Microhematuria 02/16/2015  . Renal cyst 02/16/2015  . Type 2 diabetes mellitus (HCC) 08/06/2014    Past Surgical History  Procedure Laterality Date  . Nephrolithotomy  1972  . Sinus irrigation      Current Outpatient Rx  Name  Route  Sig  Dispense  Refill  . allopurinol (ZYLOPRIM) 300 MG tablet               . amLODipine (NORVASC) 5 MG tablet      TAKE ONE TABLET EVERY DAY         . B Complex Vitamins (VITAMIN-B COMPLEX) TABS   Oral   Take by mouth.         . cephALEXin (KEFLEX) 500 MG capsule   Oral   Take 1 capsule (500 mg total) by mouth 4 (four) times daily.   40 capsule   0   . HUMALOG KWIKPEN 100 UNIT/ML KiwkPen      Reported on 03/26/2015           Dispense as written.   Marland Kitchen ibuprofen (ADVIL,MOTRIN) 200 MG tablet   Oral   Take 200 mg by mouth every 6 (six) hours as needed. Reported on 03/26/2015         . insulin aspart protamine- aspart (NOVOLOG MIX 70/30) (70-30) 100 UNIT/ML injection   Subcutaneous   Inject 10 Units into the skin 2 (two) times daily.         Marland Kitchen  LANTUS SOLOSTAR 100 UNIT/ML Solostar Pen                 Dispense as written.   Marland Kitchen levothyroxine (SYNTHROID, LEVOTHROID) 50 MCG tablet      Reported on 03/24/2015         . lovastatin (MEVACOR) 20 MG tablet               . meloxicam (MOBIC) 15 MG tablet      Reported on 03/26/2015         . ondansetron (ZOFRAN) 4 MG tablet   Oral   Take 1 tablet (4 mg total) by mouth daily as needed for nausea or vomiting.   10 tablet   0   . oxyCODONE-acetaminophen (PERCOCET) 10-325 MG tablet   Oral   Take 1 tablet by mouth every 4 (four) hours as needed for pain.   30 tablet   0   . pantoprazole (PROTONIX) 40 MG tablet               . quinapril (ACCUPRIL) 20 MG tablet               . tamsulosin (FLOMAX) 0.4 MG CAPS capsule      Reported on  03/24/2015         . traMADol (ULTRAM) 50 MG tablet   Oral   Take 1-2 tablets (50-100 mg total) by mouth every 6 (six) hours as needed for moderate pain.   50 tablet   0     Allergies Aspirin; Metformin; Pioglitazone; and Rosiglitazone  Family History  Problem Relation Age of Onset  . Hematuria Neg Hx   . Kidney cancer Neg Hx   . Prostate cancer Neg Hx   . Sickle cell anemia Neg Hx   . Tuberculosis Neg Hx     Social History Social History  Substance Use Topics  . Smoking status: Never Smoker   . Smokeless tobacco: Current User    Types: Chew  . Alcohol Use: No    Review of Systems  Constitutional: Negative for fever/chills. ENT: Negative for congestion. Cardiovascular: Negative for chest pain. Respiratory: Negative for cough. Gastrointestinal: Negative for abdominal pain, vomiting and diarrhea. Genitourinary: Patient had a sense of urinary retention without the ability to void.. Musculoskeletal: Notable for back pain since having a aspiration of the left kidney in December. See history of present illness. Skin: Negative for rash. Neurological: Negative for headache or focal weakness   10-point ROS otherwise negative.  ____________________________________________   PHYSICAL EXAM:  VITAL SIGNS: ED Triage Vitals  Enc Vitals Group     BP 03/27/15 1508 164/64 mmHg     Pulse Rate 03/27/15 1508 76     Resp 03/27/15 1508 18     Temp 03/27/15 1508 97.7 F (36.5 C)     Temp Source 03/27/15 1508 Oral     SpO2 03/27/15 1508 95 %     Weight 03/27/15 1508 240 lb (108.863 kg)     Height 03/27/15 1508  (1.651 m)     Head Cir --      Peak Flow --      Pain Score 03/27/15 1509 3     Pain Loc --      Pain Edu? --      Excl. in GC? --     Constitutional:  Alert and oriented. No acute distress. Patient does have some discomfort when he sits up. ENT   Head: Normocephalic and atraumatic.  Nose: No congestion/rhinnorhea.       Mouth: No erythema, no  swelling   Cardiovascular: Normal rate, regular rhythm, no murmur noted Respiratory:  Normal respiratory effort, no tachypnea.    Breath sounds are clear and equal bilaterally.  Gastrointestinal: Soft. Large abdomen, no tenderness. Back: There is point tenderness to the paraspinal muscles in the lumbar area, worse on the left than the right. Musculoskeletal: No deformity noted. Nontender with normal range of motion in all extremities.  No noted edema. Neurologic:  Communicative. Spontaneous movement in all 4 extremities.  Patient does have a notable tremor in the left arm.   Skin:  Skin is warm, dry. No rash noted. Psychiatric: Mood and affect are normal. Speech and behavior are normal.  ____________________________________________    LABS (pertinent positives/negatives)  Labs Reviewed  COMPREHENSIVE METABOLIC PANEL - Abnormal; Notable for the following:    Sodium 132 (*)    Potassium 5.3 (*)    Chloride 98 (*)    Glucose, Bld 144 (*)    BUN 35 (*)    Creatinine, Ser 1.74 (*)    Calcium 8.6 (*)    ALT 13 (*)    GFR calc non Af Amer 35 (*)    GFR calc Af Amer 41 (*)    All other components within normal limits  CBC - Abnormal; Notable for the following:    RBC 3.79 (*)    Hemoglobin 11.9 (*)    HCT 36.7 (*)    All other components within normal limits  URINALYSIS COMPLETEWITH MICROSCOPIC ([facility] ONLY) - Abnormal; Notable for the following:    Color, Urine YELLOW (*)    APPearance CLOUDY (*)    Ketones, ur TRACE (*)    Protein, ur 100 (*)    Leukocytes, UA 2+ (*)    Squamous Epithelial / LPF 0-5 (*)    All other components within normal limits  URINE CULTURE     ____________________________________________   EKG    ____________________________________________    RADIOLOGY  CT abdomen and pelvis:  IMPRESSION: 1. Left renal cyst measures 9.1 cm appears to have associated anterior soft tissue nodule with different attenuation than the background renal  parenchyma. As such, cystic renal cell neoplasm is a distinct concern. MRI without and with contrast is recommended to further evaluate. 2. 1-2 mm nonobstructing stone lower pole right kidney.  ____________________________________________  ____________________________________________   INITIAL IMPRESSION / ASSESSMENT AND PLAN / ED COURSE  Pertinent labs & imaging results that were available during my care of the patient were reviewed by me and considered in my medical decision making (see chart for details).  Patient with multiple vague complaints. The was a question of whether he had altered mental status. It sounds like he was uncomfortable, but did not have any loss of cognition. He has been stable during a prolonged wait in the emergency department. The daughter is concerned about the kidney. We discussed the pros and cons of noncontrast versus contrasted CT scan. We will obtain a noncontrasted scan at this time. He is under ongoing care by the urologist and has an appointment on Monday. If the noncontrasted CT is reasonable, and given his recent ultrasound on Tuesday and close follow-up, he will likely be able to go home.  ----------------------------------------- 8:18 PM on 03/27/2015 -----------------------------------------  The CT scan shows the left renal cyst. It measures 9.1 cm.   CT from one year prior showed a 9 cm cyst as well. I also found earlier this week showed  a 7 x 8.8 cm cyst.  Today CT does show some nodular area around the cyst. Radiology identifies the need for further evaluation by MRI. I've called MRI, but they are not here on a Friday evening. I do not think that the MRI is urgent given the long-standing presence of this cyst and the overall well appearance of the patient. Also, I reviewed his systolic she report from the aspiration last month. It did not show cancer cells.  I discussed this with the family. The patient is doing better. His blood pressure has  eased down. He did throw up earlier after drinking water when he laid back flat for the CT scan. At this time he is tolerating liquids by mouth. Will treat the urinary tract infection he has with ceftriaxone IV and give him a 500 mL bolus.  ----------------------------------------- 9:45 PM on 03/27/2015 -----------------------------------------  Patient feels immediately better. He has been eating some food and tolerating that well. He looks calm and comfortable. We'll discharge him home with Keflex for the urinary tract infection and with a perception for Zofran in case he has any further nausea. The daughter and the patient aren't agreement with this plan. I discussed with them the recommendation from radiology for an MRI to follow-up on the soft tissue nodule adjacent to the cyst.  ____________________________________________   FINAL CLINICAL IMPRESSION(S) / ED DIAGNOSES  Final diagnoses:  Renal cyst  Left-sided low back pain without sciatica  Acute cystitis without hematuria      Darien Ramus, MD 03/27/15 2153

## 2015-03-27 NOTE — ED Notes (Signed)
Pt. Going home with family. 

## 2015-03-27 NOTE — ED Notes (Signed)
CT called to report pt. Had vomited during CT scan.

## 2015-03-27 NOTE — ED Notes (Signed)
Patient presents to the ED feeling shaky, confused and tired.  Patient was seen at [facility] yesterday due to a cyst on his kidney.  Patient has been taking tramadol for pain however it has been making him very tired so they switched patient to percocet.  Patient took a percocet yesterday afternoon and felt fine but then he took another one at 8am this morning and after that patient felt strange, shaky, and confused.  Patient reports infrequent urination as well.

## 2015-03-27 NOTE — ED Notes (Signed)
Patient transported to CT 

## 2015-03-27 NOTE — Discharge Instructions (Signed)
You have a urinary tract infection. You also have the previously known about cyst on the left kidney. The CT scan shows some soft tissue nodule that could be better evaluated by MRI. We discussed this in the emergency department. Please speak with your primary physician or urologist for additional evaluation of this concern. He did have point tenderness on your back and your pain may be primarily musculoskeletal. He may continue to use Percocet. Also take Keflex for infection and use Zofran if needed for nausea. Return to the emergency department if you have worsening pain, if he have more vomiting, or if you have other urgent concerns.  Back Pain, Adult Back pain is very common. The pain often gets better over time. The cause of back pain is usually not dangerous. Most people can learn to manage their back pain on their own.  HOME CARE  Watch your back pain for any changes. The following actions may help to lessen any pain you are feeling:  Stay active. Start with short walks on flat ground if you can. Try to walk farther each day.  Exercise regularly as told by your doctor. Exercise helps your back heal faster. It also helps avoid future injury by keeping your muscles strong and flexible.  Do not sit, drive, or stand in one place for more than 30 minutes.  Do not stay in bed. Resting more than 1-2 days can slow down your recovery.  Be careful when you bend or lift an object. Use good form when lifting:  Bend at your knees.  Keep the object close to your body.  Do not twist.  Sleep on a firm mattress. Lie on your side, and bend your knees. If you lie on your back, put a pillow under your knees.  Take medicines only as told by your doctor.  Put ice on the injured area.  Put ice in a plastic bag.  Place a towel between your skin and the bag.  Leave the ice on for 20 minutes, 2-3 times a day for the first 2-3 days. After that, you can switch between ice and heat packs.  Avoid feeling  anxious or stressed. Find good ways to deal with stress, such as exercise.  Maintain a healthy weight. Extra weight puts stress on your back. GET HELP IF:   You have pain that does not go away with rest or medicine.  You have worsening pain that goes down into your legs or buttocks.  You have pain that does not get better in one week.  You have pain at night.  You lose weight.  You have a fever or chills. GET HELP RIGHT AWAY IF:   You cannot control when you poop (bowel movement) or pee (urinate).  Your arms or legs feel weak.  Your arms or legs lose feeling (numbness).  You feel sick to your stomach (nauseous) or throw up (vomit).  You have belly (abdominal) pain.  You feel like you may pass out (faint).   This information is not intended to replace advice given to you by your health care provider. Make sure you discuss any questions you have with your health care provider.   Document Released: 08/03/2007 Document Revised: 03/07/2014 Document Reviewed: 06/18/2013 Elsevier Interactive Patient Education [employer]

## 2015-03-29 DIAGNOSIS — M549 Dorsalgia, unspecified: Secondary | ICD-10-CM | POA: Insufficient documentation

## 2015-03-29 DIAGNOSIS — R109 Unspecified abdominal pain: Secondary | ICD-10-CM | POA: Insufficient documentation

## 2015-03-29 DIAGNOSIS — N281 Cyst of kidney, acquired: Secondary | ICD-10-CM | POA: Insufficient documentation

## 2015-03-29 LAB — URINE CULTURE: Culture: NO GROWTH

## 2015-03-30 ENCOUNTER — Encounter: Payer: Self-pay | Admitting: Urology

## 2015-03-30 ENCOUNTER — Ambulatory Visit (INDEPENDENT_AMBULATORY_CARE_PROVIDER_SITE_OTHER): Payer: PPO | Admitting: Urology

## 2015-03-30 VITALS — BP 168/63 | HR 77 | Ht 65.0 in | Wt 238.9 lb

## 2015-03-30 DIAGNOSIS — Q6100 Congenital renal cyst, unspecified: Secondary | ICD-10-CM

## 2015-03-30 DIAGNOSIS — N281 Cyst of kidney, acquired: Secondary | ICD-10-CM

## 2015-03-30 LAB — MICROSCOPIC EXAMINATION: RBC, UA: NONE SEEN /hpf (ref 0–?)

## 2015-03-30 LAB — URINALYSIS, COMPLETE
BILIRUBIN UA: NEGATIVE
Glucose, UA: NEGATIVE
Ketones, UA: NEGATIVE
LEUKOCYTES UA: NEGATIVE
Nitrite, UA: NEGATIVE
PH UA: 7 (ref 5.0–7.5)
RBC UA: NEGATIVE
Specific Gravity, UA: 1.015 (ref 1.005–1.030)
Urobilinogen, Ur: 0.2 mg/dL (ref 0.2–1.0)

## 2015-03-30 NOTE — Progress Notes (Signed)
82 year old male who returns today for further evaluation of his left back an lateral abdominal pain. The patient underwent a cyst aspiration approximately 1 month ago for this issue , his issue was made worse. He is been taking ibuprofen and other NSAIDs to help treat his pain.  I ordered an ultrasound at his last visit to ensure that the patient did not have a significant hematoma. The cyst was simple appearing although there was a dense material within the cysts, likely hemorrhage. The patient then developed progressive pain and felt as if he had deteriorated and presented to the ER. There, the patient underwent a noncontrast CT scan which demonstrated a solid component to the cyst which has completely reaccumulated. In addition, the patient was noted to have a urinary tract infection. He was started on Keflex, and his symptoms have improved since then. The patient also has been treated for his constipation, although he continues to struggle with this. The constipation has been an issue for him since this pain began 6 weeks ago.   The patient's voiding symptoms have improved since starting the antibiotic. He denies any hematuria. He denies any fevers or chills. He is having urinary frequency, urgency, and worsening..  Current Outpatient Prescriptions on File Prior to Visit  Medication Sig Dispense Refill  . allopurinol (ZYLOPRIM) 300 MG tablet     . amLODipine (NORVASC) 5 MG tablet TAKE ONE TABLET EVERY DAY    . B Complex Vitamins (VITAMIN-B COMPLEX) TABS Take by mouth.    . cephALEXin (KEFLEX) 500 MG capsule Take 1 capsule (500 mg total) by mouth 4 (four) times daily. 40 capsule 0  . HUMALOG KWIKPEN 100 UNIT/ML KiwkPen Reported on 03/26/2015    . ibuprofen (ADVIL,MOTRIN) 200 MG tablet Take 200 mg by mouth every 6 (six) hours as needed. Reported on 03/26/2015    . insulin aspart protamine- aspart (NOVOLOG MIX 70/30) (70-30) 100 UNIT/ML injection Inject 10 Units into the skin 2 (two) times daily.    Marland Kitchen  LANTUS SOLOSTAR 100 UNIT/ML Solostar Pen     . levothyroxine (SYNTHROID, LEVOTHROID) 50 MCG tablet Reported on 03/24/2015    . lovastatin (MEVACOR) 20 MG tablet     . meloxicam (MOBIC) 15 MG tablet Reported on 03/26/2015    . ondansetron (ZOFRAN) 4 MG tablet Take 1 tablet (4 mg total) by mouth daily as needed for nausea or vomiting. 10 tablet 0  . pantoprazole (PROTONIX) 40 MG tablet     . quinapril (ACCUPRIL) 20 MG tablet     . tamsulosin (FLOMAX) 0.4 MG CAPS capsule Reported on 03/24/2015    . traMADol (ULTRAM) 50 MG tablet Take 1-2 tablets (50-100 mg total) by mouth every 6 (six) hours as needed for moderate pain. 50 tablet 0  . oxyCODONE-acetaminophen (PERCOCET) 10-325 MG tablet Take 1 tablet by mouth every 4 (four) hours as needed for pain. (Patient not taking: Reported on 03/30/2015) 30 tablet 0   No current facility-administered medications on file prior to visit.   Past Medical History  Diagnosis Date  . Essential (primary) hypertension 02/16/2015  . Diverticular disease of large intestine 02/16/2015  . Acquired hypothyroidism 02/16/2015  . Diabetes mellitus (HCC) 02/16/2015    Overview:  insulin 2005 last eye exam 01/2013 [facility]   . Depression   . Glaucoma   . Gout    Exam: Filed Vitals:   03/30/15 0945  BP: 168/63  Pulse: 77   NAD Slight left CVA tenderness with point tenderness just over the  iliac crest No edema of lower extremity pain   Imaging: I independently reviewed the patient's CT scan. I suspect that the area of concern , the solid component superior to the cystic mass in the left lower pole is renal parenchyma. However, there is no contrast in this study.   Impression: the patient has ongoing left sided back and abdominal pain. This is most certainly not renal in origin. It may well be constipation. He also has a solid component to the cystic mass, this is been present on previous images. Although was not delineated.    Discussion: Our plan at this  point is to have the patient repeat an MRI in 3 months. I also implored him stop taking NSAIDs as this appears to have affected his kidney function. I've given the patient tramadol for his pain. He should continue to take the Keflex as this also seems to be helping his situation. The patient will return to see me in 3 months.

## 2015-04-02 ENCOUNTER — Telehealth: Payer: Self-pay

## 2015-04-02 NOTE — Telephone Encounter (Signed)
Pt daughter called stating Dr. Marlou Porch advised pt to start miralax on Monday for possible constipation. Pt daughter states she gave pt miralax one time, Tuesday night, and since then his stools have been completely water and black. Daughter described pt to be in severe pain, tamadol is not touching the pain, and has not eaten or drank anything all week. Daughter states she called PCP(Dr. Livonthong at [facility]) and they advised her to call us. Please advise.

## 2015-04-02 NOTE — Telephone Encounter (Signed)
Pt daughter called back stating she was going to take pt to urgent care at [facility]. Pt is still in extreme pain and continuing to have black very loose stools. Reassured daughter that was a good choice at this time.

## 2015-04-07 ENCOUNTER — Ambulatory Visit: Payer: PPO

## 2015-04-08 ENCOUNTER — Other Ambulatory Visit: Payer: Self-pay

## 2015-04-08 DIAGNOSIS — N281 Cyst of kidney, acquired: Secondary | ICD-10-CM

## 2015-04-13 ENCOUNTER — Emergency Department
Admission: EM | Admit: 2015-04-13 | Discharge: 2015-04-13 | Disposition: A | Discharge status: Home / Self Care | Payer: PPO | Attending: Emergency Medicine | Admitting: Emergency Medicine

## 2015-04-13 ENCOUNTER — Emergency Department: Payer: PPO

## 2015-04-13 ENCOUNTER — Encounter: Payer: Self-pay | Admitting: Emergency Medicine

## 2015-04-13 DIAGNOSIS — Z79899 Other long term (current) drug therapy: Secondary | ICD-10-CM | POA: Insufficient documentation

## 2015-04-13 DIAGNOSIS — Z794 Long term (current) use of insulin: Secondary | ICD-10-CM | POA: Insufficient documentation

## 2015-04-13 DIAGNOSIS — I10 Essential (primary) hypertension: Secondary | ICD-10-CM | POA: Diagnosis not present

## 2015-04-13 DIAGNOSIS — Z791 Long term (current) use of non-steroidal anti-inflammatories (NSAID): Secondary | ICD-10-CM | POA: Insufficient documentation

## 2015-04-13 DIAGNOSIS — K5900 Constipation, unspecified: Secondary | ICD-10-CM | POA: Diagnosis not present

## 2015-04-13 DIAGNOSIS — E119 Type 2 diabetes mellitus without complications: Secondary | ICD-10-CM | POA: Insufficient documentation

## 2015-04-13 HISTORY — DX: Disorder of kidney and ureter, unspecified: N28.9

## 2015-04-13 LAB — GLUCOSE, CAPILLARY: GLUCOSE-CAPILLARY: 181 mg/dL — AB (ref 65–99)

## 2015-04-13 MED ORDER — SENNOSIDES-DOCUSATE SODIUM 8.6-50 MG PO TABS: 2.0000 | ORAL_TABLET | Freq: Every day | ORAL | Status: DC | PRN

## 2015-04-13 MED ORDER — LACTULOSE 10 GM/15ML PO SOLN: 30.0000 g | Freq: Every day | ORAL | Status: DC | PRN

## 2015-04-13 MED ORDER — LACTULOSE 10 GM/15ML PO SOLN
30.0000 g | Freq: Once | ORAL | Status: AC
  Administered 2015-04-13: 30 g via ORAL
  Filled 2015-04-13: qty 60

## 2015-04-13 NOTE — Discharge Instructions (Signed)
Please eat a high-fiber diet and take a daily stool softener to prevent constipation. If you develop constipation, you may take lactulose. You may also try over-the-counter enemas in the future.  Return to the emergency department if he develops severe pain, fever, vomiting, or any other symptoms concerning to you.  Constipation, Adult Constipation is when a person has fewer than three bowel movements a week, has difficulty having a bowel movement, or has stools that are dry, hard, or larger than normal. As people grow older, constipation is more common. A low-fiber diet, not taking in enough fluids, and taking certain medicines may make constipation worse.  CAUSES   Certain medicines, such as antidepressants, pain medicine, iron supplements, antacids, and water pills.   Certain diseases, such as diabetes, irritable bowel syndrome (IBS), thyroid disease, or depression.   Not drinking enough water.   Not eating enough fiber-rich foods.   Stress or travel.   Lack of physical activity or exercise.   Ignoring the urge to have a bowel movement.   Using laxatives too much.  SIGNS AND SYMPTOMS   Having fewer than three bowel movements a week.   Straining to have a bowel movement.   Having stools that are hard, dry, or larger than normal.   Feeling full or bloated.   Pain in the lower abdomen.   Not feeling relief after having a bowel movement.  DIAGNOSIS  Your health care provider will take a medical history and perform a physical exam. Further testing may be done for severe constipation. Some tests may include:  A barium enema X-ray to examine your rectum, colon, and, sometimes, your small intestine.   A sigmoidoscopy to examine your lower colon.   A colonoscopy to examine your entire colon. TREATMENT  Treatment will depend on the severity of your constipation and what is causing it. Some dietary treatments include drinking more fluids and eating more fiber-rich  foods. Lifestyle treatments may include regular exercise. If these diet and lifestyle recommendations do not help, your health care provider may recommend taking over-the-counter laxative medicines to help you have bowel movements. Prescription medicines may be prescribed if over-the-counter medicines do not work.  HOME CARE INSTRUCTIONS   Eat foods that have a lot of fiber, such as fruits, vegetables, whole grains, and beans.  Limit foods high in fat and processed sugars, such as french fries, hamburgers, cookies, candies, and soda.   A fiber supplement may be added to your diet if you cannot get enough fiber from foods.   Drink enough fluids to keep your urine clear or pale yellow.   Exercise regularly or as directed by your health care provider.   Go to the restroom when you have the urge to go. Do not hold it.   Only take over-the-counter or prescription medicines as directed by your health care provider. Do not take other medicines for constipation without talking to your health care provider first.  SEEK IMMEDIATE MEDICAL CARE IF:   You have bright red blood in your stool.   Your constipation lasts for more than 4 days or gets worse.   You have abdominal or rectal pain.   You have thin, pencil-like stools.   You have unexplained weight loss. MAKE SURE YOU:   Understand these instructions.  Will watch your condition.  Will get help right away if you are not doing well or get worse.   This information is not intended to replace advice given to you by your health care provider.  Make sure you discuss any questions you have with your health care provider.   Document Released: 11/13/2003 Document Revised: 03/07/2014 Document Reviewed: 11/26/2012 Elsevier Interactive Patient Education [employer].

## 2015-04-13 NOTE — ED Provider Notes (Signed)
[facility] Emergency Department Provider Note  ____________________________________________  Time seen: Approximately 11:05 AM  I have reviewed the triage vital signs and the nursing notes.   HISTORY  Chief Complaint Constipation    HPI Juan Walker is a 82 y.o. male with a history of constipation presenting with constipation, left-sided abdominal pain. Pt reports that he has had difficult to regulate bowels since undergoing multiple renal procedures in December.The patient has not had a bowel movement in 8 days, and is no longer passing gas. He attempted to doses of MiraLAX yesterday and a stool softener without any improvement. Over the past 24 hours he has developed a left-sided abdominal discomfort. He has not had any nausea or vomiting, fever, chills. No urinary symptoms.   Past Medical History  Diagnosis Date  . Essential (primary) hypertension 02/16/2015  . Diverticular disease of large intestine 02/16/2015  . Acquired hypothyroidism 02/16/2015  . Diabetes mellitus (HCC) 02/16/2015    Overview:  insulin 2005 last eye exam 01/2013 [facility]   . Depression   . Glaucoma   . Gout   . Renal disorder     Patient Active Problem List   Diagnosis Date Noted  . Notalgia 03/29/2015  . Renal cyst, left 03/29/2015  . Left sided abdominal pain 03/29/2015  . Acquired hypothyroidism 02/16/2015  . Benign fibroma of prostate 02/16/2015  . Cataract 02/16/2015  . Chronic anemia 02/16/2015  . Diverticular disease of large intestine 02/16/2015  . Essential (primary) hypertension 02/16/2015  . Gout 02/16/2015  . Morbid obesity (HCC) 02/16/2015  . Psoriasis 02/16/2015  . Diabetes mellitus (HCC) 02/16/2015  . Microhematuria 02/16/2015  . Renal cyst 02/16/2015  . Type 2 diabetes mellitus (HCC) 08/06/2014    Past Surgical History  Procedure Laterality Date  . Nephrolithotomy  1972  . Sinus irrigation      Current Outpatient Rx  Name  Route   Sig  Dispense  Refill  . allopurinol (ZYLOPRIM) 300 MG tablet               . amLODipine (NORVASC) 5 MG tablet      TAKE ONE TABLET EVERY DAY         . B Complex Vitamins (VITAMIN-B COMPLEX) TABS   Oral   Take by mouth.         Marland Kitchen HUMALOG KWIKPEN 100 UNIT/ML KiwkPen      Reported on 03/26/2015           Dispense as written.   Marland Kitchen ibuprofen (ADVIL,MOTRIN) 200 MG tablet   Oral   Take 200 mg by mouth every 6 (six) hours as needed. Reported on 03/26/2015         . insulin aspart protamine- aspart (NOVOLOG MIX 70/30) (70-30) 100 UNIT/ML injection   Subcutaneous   Inject 10 Units into the skin 2 (two) times daily.         Marland Kitchen lactulose (CHRONULAC) 10 GM/15ML solution   Oral   Take 45 mLs (30 g total) by mouth daily as needed for severe constipation.   120 mL   0   . LANTUS SOLOSTAR 100 UNIT/ML Solostar Pen                 Dispense as written.   Marland Kitchen levothyroxine (SYNTHROID, LEVOTHROID) 50 MCG tablet      Reported on 03/24/2015         . lovastatin (MEVACOR) 20 MG tablet               .  meloxicam (MOBIC) 15 MG tablet      Reported on 03/26/2015         . ondansetron (ZOFRAN) 4 MG tablet   Oral   Take 1 tablet (4 mg total) by mouth daily as needed for nausea or vomiting.   10 tablet   0   . oxyCODONE-acetaminophen (PERCOCET) 10-325 MG tablet   Oral   Take 1 tablet by mouth every 4 (four) hours as needed for pain. Patient not taking: Reported on 03/30/2015   30 tablet   0   . pantoprazole (PROTONIX) 40 MG tablet               . quinapril (ACCUPRIL) 20 MG tablet               . senna-docusate (SENOKOT-S) 8.6-50 MG tablet   Oral   Take 2 tablets by mouth daily as needed for mild constipation.   20 tablet   0   . tamsulosin (FLOMAX) 0.4 MG CAPS capsule      Reported on 03/24/2015         . traMADol (ULTRAM) 50 MG tablet   Oral   Take 1-2 tablets (50-100 mg total) by mouth every 6 (six) hours as needed for moderate pain.   50  tablet   0     Allergies Aspirin; Metformin; Pioglitazone; and Rosiglitazone  Family History  Problem Relation Age of Onset  . Hematuria Neg Hx   . Kidney cancer Neg Hx   . Prostate cancer Neg Hx   . Sickle cell anemia Neg Hx   . Tuberculosis Neg Hx     Social History Social History  Substance Use Topics  . Smoking status: Never Smoker   . Smokeless tobacco: Current User    Types: Chew  . Alcohol Use: No    Review of Systems Constitutional: No fever/chills area and no lightheadedness or syncope. Eyes: No visual changes. ENT: No sore throat. Cardiovascular: Denies chest pain, palpitations. Respiratory: Denies shortness of breath.  No cough. Gastrointestinal: Positive diffuse left-sided abdominal pain.  No nausea, no vomiting.  No diarrhea.  Positive constipation. Not passing flatus. Positive decreased appetite. Genitourinary: Negative for dysuria. Musculoskeletal: Negative for back pain. Skin: Negative for rash. Neurological: Negative for headaches, focal weakness or numbness.  10-point ROS otherwise negative.  ____________________________________________   PHYSICAL EXAM:  VITAL SIGNS: ED Triage Vitals  Enc Vitals Group     BP 04/13/15 1017 142/67 mmHg     Pulse Rate 04/13/15 1017 87     Resp 04/13/15 1017 18     Temp 04/13/15 1017 97.5 F (36.4 C)     Temp Source 04/13/15 1017 Oral     SpO2 04/13/15 1017 99 %     Weight 04/13/15 1017 229 lb (103.874 kg)     Height 04/13/15 1017  (1.651 m)     Head Cir --      Peak Flow --      Pain Score 04/13/15 1019 0     Pain Loc --      Pain Edu? --      Excl. in GC? --     Constitutional: Alert and oriented. Well appearing and in no acute distress. Answer question appropriately. Eyes: Conjunctivae are normal.  EOMI. no scleral icterus. Head: Atraumatic. Nose: No congestion/rhinnorhea. Mouth/Throat: Mucous membranes are moist.  Neck: No stridor.  Supple.   Cardiovascular: Normal rate, regular rhythm. No  murmurs, rubs or gallops.  Respiratory: Normal respiratory effort.  No retractions. Lungs CTAB.  No wheezes, rales or ronchi. Gastrointestinal: Abdomen is obese, soft and nondistended. Patient has diffuse tenderness to palpation without focality in the left side, but the upper quadrant and the lower quadrant. No rebound or guarding. No peritoneal signs. Musculoskeletal: No LE edema.  Neurologic:  Normal speech and language. No gross focal neurologic deficits are appreciated.  Skin:  Skin is warm, dry and intact. No rash noted. Psychiatric: Mood and affect are normal. Speech and behavior are normal.  Normal judgement.  ____________________________________________   LABS (all labs ordered are listed, but only abnormal results are displayed)  Labs Reviewed - No data to display ____________________________________________  EKG  Not indicated ____________________________________________  RADIOLOGY  Dg Abd Acute W/chest  04/13/2015  CLINICAL DATA:  Constipation for 1 week. EXAM: DG ABDOMEN ACUTE W/ 1V CHEST COMPARISON:  None. FINDINGS: There is a large amount of stool throughout the colon. There is no evidence of dilated bowel loops or free intraperitoneal air. No radiopaque calculi or other significant radiographic abnormality is seen. Heart size and mediastinal contours are within normal limits. Both lungs are clear. IMPRESSION: Negative abdominal radiographs.  No acute cardiopulmonary disease. Electronically Signed   By: Elige Ko   On: 04/13/2015 11:40    ____________________________________________   PROCEDURES  Procedure(s) performed: None  Critical Care performed: No ____________________________________________   INITIAL IMPRESSION / ASSESSMENT AND PLAN / ED COURSE  Pertinent labs & imaging results that were available during my care of the patient were reviewed by me and considered in my medical decision making (see chart for details).  82 y.o. with a history of  constipation presenting with 8 days of no bowel movement, no longer passing gas. He does not have any nausea or vomiting, this could be early or partial small bowel obstruction, but constipation is also very possible etiology. We'll start with a plain x-ray, and aggressive bowel regimen. If this does not help, we will consider proceeding with labs and CT scan to evaluate for obstruction.  ----------------------------------------- 1:28 PM on 04/13/2015 -----------------------------------------  The patient was able to produce a voluminous amount of stool after lactulose and an enema. His abdominal x-ray does not show any obstructive gas pattern. His pain has significantly improved since his bowel movement. Plan discharge. Patient and his daughter understand return precautions as well as follow-up instructions.  I was notified by the nurse that the patient was found lying on the ground in the bathroom. The patient reports that he laid himself down, did not fall, did not have a syncopal episode. At this time, he is a some dramatic.  ____________________________________________  FINAL CLINICAL IMPRESSION(S) / ED DIAGNOSES  Final diagnoses:  Constipation, unspecified constipation type      NEW MEDICATIONS STARTED DURING THIS VISIT:  New Prescriptions   LACTULOSE (CHRONULAC) 10 GM/15ML SOLUTION    Take 45 mLs (30 g total) by mouth daily as needed for severe constipation.   SENNA-DOCUSATE (SENOKOT-S) 8.6-50 MG TABLET    Take 2 tablets by mouth daily as needed for mild constipation.     Rockne Menghini, MD 04/13/15 1328

## 2015-04-13 NOTE — ED Notes (Signed)
Pt calling out from in the restroom in room 1, pt was found on the floor. Staff asked the patient who is alert and oriented x4, pt was asked if he fell, pt responded with "no I did not fall, I got too weak and I laid down on the floor" pt continues to have oozing soft stool from the rectum , pt was assisted to a standing position with 2 staff members, pt was assisted to the stretcher and was cleaned up. A large amount of soft formed stool in the toilet, Dr.Norman aware of the pt lying on the floor and + BM

## 2015-04-13 NOTE — ED Notes (Signed)
Pt arrived via EMS from home for complaints of constipation. EMS reports CBG 114, 87/58 BP. NAD noted on arrival.

## 2015-04-23 ENCOUNTER — Ambulatory Visit
Admission: RE | Admit: 2015-04-23 | Discharge: 2015-04-23 | Disposition: A | Discharge status: Home / Self Care | Payer: PPO | Source: Ambulatory Visit | Attending: Urology | Admitting: Urology

## 2015-04-23 DIAGNOSIS — K862 Cyst of pancreas: Secondary | ICD-10-CM | POA: Diagnosis not present

## 2015-04-23 DIAGNOSIS — Q6100 Congenital renal cyst, unspecified: Secondary | ICD-10-CM | POA: Insufficient documentation

## 2015-04-23 DIAGNOSIS — N281 Cyst of kidney, acquired: Secondary | ICD-10-CM

## 2015-04-23 MED ORDER — GADOBENATE DIMEGLUMINE 529 MG/ML IV SOLN
10.0000 mL | Freq: Once | INTRAVENOUS | Status: AC | PRN
  Administered 2015-04-23: 10 mL via INTRAVENOUS

## 2015-04-24 ENCOUNTER — Encounter: Payer: Self-pay | Admitting: *Deleted

## 2015-04-27 ENCOUNTER — Ambulatory Visit: Payer: PPO | Admitting: Anesthesiology

## 2015-04-27 ENCOUNTER — Ambulatory Visit
Admission: RE | Admit: 2015-04-27 | Discharge: 2015-04-27 | Disposition: A | Discharge status: Home / Self Care | Payer: PPO | Source: Ambulatory Visit | Attending: Unknown Physician Specialty | Admitting: Unknown Physician Specialty

## 2015-04-27 ENCOUNTER — Telehealth: Payer: Self-pay

## 2015-04-27 ENCOUNTER — Encounter
Admission: RE | Disposition: A | Discharge status: Home / Self Care | Payer: Self-pay | Source: Ambulatory Visit | Attending: Unknown Physician Specialty

## 2015-04-27 ENCOUNTER — Encounter: Payer: Self-pay | Admitting: *Deleted

## 2015-04-27 DIAGNOSIS — R110 Nausea: Secondary | ICD-10-CM | POA: Insufficient documentation

## 2015-04-27 DIAGNOSIS — Z6841 Body Mass Index (BMI) 40.0 and over, adult: Secondary | ICD-10-CM | POA: Diagnosis not present

## 2015-04-27 DIAGNOSIS — I10 Essential (primary) hypertension: Secondary | ICD-10-CM | POA: Diagnosis not present

## 2015-04-27 DIAGNOSIS — E119 Type 2 diabetes mellitus without complications: Secondary | ICD-10-CM | POA: Diagnosis not present

## 2015-04-27 DIAGNOSIS — Z87891 Personal history of nicotine dependence: Secondary | ICD-10-CM | POA: Insufficient documentation

## 2015-04-27 DIAGNOSIS — K2970 Gastritis, unspecified, without bleeding: Secondary | ICD-10-CM | POA: Diagnosis not present

## 2015-04-27 DIAGNOSIS — R1013 Epigastric pain: Secondary | ICD-10-CM | POA: Diagnosis present

## 2015-04-27 DIAGNOSIS — R197 Diarrhea, unspecified: Secondary | ICD-10-CM | POA: Diagnosis not present

## 2015-04-27 DIAGNOSIS — M109 Gout, unspecified: Secondary | ICD-10-CM | POA: Diagnosis not present

## 2015-04-27 DIAGNOSIS — Z888 Allergy status to other drugs, medicaments and biological substances status: Secondary | ICD-10-CM | POA: Diagnosis not present

## 2015-04-27 DIAGNOSIS — E039 Hypothyroidism, unspecified: Secondary | ICD-10-CM | POA: Insufficient documentation

## 2015-04-27 DIAGNOSIS — F329 Major depressive disorder, single episode, unspecified: Secondary | ICD-10-CM | POA: Diagnosis not present

## 2015-04-27 DIAGNOSIS — R1011 Right upper quadrant pain: Secondary | ICD-10-CM | POA: Diagnosis not present

## 2015-04-27 DIAGNOSIS — K5900 Constipation, unspecified: Secondary | ICD-10-CM | POA: Insufficient documentation

## 2015-04-27 DIAGNOSIS — N400 Enlarged prostate without lower urinary tract symptoms: Secondary | ICD-10-CM | POA: Insufficient documentation

## 2015-04-27 DIAGNOSIS — H409 Unspecified glaucoma: Secondary | ICD-10-CM | POA: Diagnosis not present

## 2015-04-27 DIAGNOSIS — K219 Gastro-esophageal reflux disease without esophagitis: Secondary | ICD-10-CM | POA: Insufficient documentation

## 2015-04-27 DIAGNOSIS — Z79899 Other long term (current) drug therapy: Secondary | ICD-10-CM | POA: Insufficient documentation

## 2015-04-27 DIAGNOSIS — Z794 Long term (current) use of insulin: Secondary | ICD-10-CM | POA: Diagnosis not present

## 2015-04-27 HISTORY — DX: Diverticulosis of large intestine without perforation or abscess without bleeding: K57.30

## 2015-04-27 HISTORY — DX: Unspecified cataract: H26.9

## 2015-04-27 HISTORY — DX: Psoriasis, unspecified: L40.9

## 2015-04-27 HISTORY — DX: Anemia, unspecified: D64.9

## 2015-04-27 HISTORY — PX: ESOPHAGOGASTRODUODENOSCOPY (EGD) WITH PROPOFOL: SHX5813

## 2015-04-27 HISTORY — DX: Zoster without complications: B02.9

## 2015-04-27 HISTORY — DX: Benign prostatic hyperplasia without lower urinary tract symptoms: N40.0

## 2015-04-27 HISTORY — DX: Morbid (severe) obesity due to excess calories: E66.01

## 2015-04-27 HISTORY — DX: Personal history of other diseases of male genital organs: Z87.438

## 2015-04-27 LAB — GLUCOSE, CAPILLARY: Glucose-Capillary: 135 mg/dL — ABNORMAL HIGH (ref 65–99)

## 2015-04-27 SURGERY — ESOPHAGOGASTRODUODENOSCOPY (EGD) WITH PROPOFOL
Anesthesia: General

## 2015-04-27 MED ORDER — FENTANYL CITRATE (PF) 100 MCG/2ML IJ SOLN
INTRAMUSCULAR | Status: DC | PRN
  Administered 2015-04-27: 50 ug via INTRAVENOUS

## 2015-04-27 MED ORDER — PROPOFOL 500 MG/50ML IV EMUL
INTRAVENOUS | Status: DC | PRN
  Administered 2015-04-27: 50 ug/kg/min via INTRAVENOUS

## 2015-04-27 MED ORDER — SODIUM CHLORIDE 0.9 % IV SOLN
INTRAVENOUS | Status: DC
Start: 2015-04-27 — End: 2015-04-27
  Administered 2015-04-27: 1000 mL via INTRAVENOUS

## 2015-04-27 MED ORDER — LIDOCAINE HCL (PF) 2 % IJ SOLN
INTRAMUSCULAR | Status: DC | PRN
  Administered 2015-04-27: 60 mg

## 2015-04-27 MED ORDER — PROPOFOL 10 MG/ML IV BOLUS
INTRAVENOUS | Status: DC | PRN
  Administered 2015-04-27: 30 mg via INTRAVENOUS

## 2015-04-27 MED ORDER — SODIUM CHLORIDE 0.9 % IV SOLN
INTRAVENOUS | Status: DC
Start: 2015-04-27 — End: 2015-04-27

## 2015-04-27 NOTE — H&P (Signed)
Primary Care Physician:  Marisue Ivan, MD Primary Gastroenterologist:  Dr. Mechele Collin  Pre-Procedure History & Physical: HPI:  Juan Walker is a 82 y.o. male is here for an endoscopy.   Past Medical History  Diagnosis Date  . Essential (primary) hypertension 02/16/2015  . Diverticular disease of large intestine 02/16/2015  . Acquired hypothyroidism 02/16/2015  . Diabetes mellitus (HCC) 02/16/2015    Overview:  insulin 2005 last eye exam 01/2013 [facility]   . Depression   . Glaucoma   . Gout   . Renal disorder   . BPH (benign prostatic hyperplasia)   . BPH (benign prostatic hypertrophy)   . Cataract     Right eye  . Anemia     chronic  . Diverticulosis of colon without hemorrhage   . Gout   . History of prostatitis   . Morbid obesity (HCC)   . Psoriasis   . Shingles     Past Surgical History  Procedure Laterality Date  . Nephrolithotomy  1972  . Sinus irrigation    . Excision benign skin lesion ears    . Kidney stones removed      Prior to Admission medications   Medication Sig Start Date End Date Taking? Authorizing Provider  oxyCODONE-acetaminophen (PERCOCET) 10-325 MG tablet Take 1 tablet by mouth every 4 (four) hours as needed for pain. 03/26/15  Yes Shannon A McGowan, PA-C  psyllium (REGULOID) 0.52 g capsule Take 0.52 g by mouth daily.   Yes Historical Provider, MD  saccharomyces boulardii (FLORASTOR) 250 MG capsule Take 250 mg by mouth 2 (two) times daily.   Yes Historical Provider, MD  sucralfate (CARAFATE) 1 g tablet Take 1 g by mouth 4 (four) times daily -  with meals and at bedtime.   Yes Historical Provider, MD  allopurinol (ZYLOPRIM) 300 MG tablet  12/15/14   Historical Provider, MD  amLODipine (NORVASC) 5 MG tablet TAKE ONE TABLET EVERY DAY 09/25/14   Historical Provider, MD  B Complex Vitamins (VITAMIN-B COMPLEX) TABS Take by mouth.    Historical Provider, MD  cephALEXin (KEFLEX) 500 MG capsule Take 500 mg by mouth 4 (four) times daily.  Reported on 04/27/2015    Historical Provider, MD  Catalina Lunger 100 UNIT/ML KiwkPen Reported on 03/26/2015 12/08/14   Historical Provider, MD  ibuprofen (ADVIL,MOTRIN) 200 MG tablet Take 200 mg by mouth every 6 (six) hours as needed. Reported on 03/26/2015    Historical Provider, MD  insulin aspart protamine- aspart (NOVOLOG MIX 70/30) (70-30) 100 UNIT/ML injection Inject 10 Units into the skin 2 (two) times daily.    Historical Provider, MD  lactulose (CHRONULAC) 10 GM/15ML solution Take 45 mLs (30 g total) by mouth daily as needed for severe constipation. 04/13/15   Rockne Menghini, MD  LANTUS SOLOSTAR 100 UNIT/ML Solostar Pen  01/19/15   Historical Provider, MD  levothyroxine (SYNTHROID, LEVOTHROID) 50 MCG tablet Reported on 03/24/2015 12/29/14   Historical Provider, MD  lovastatin (MEVACOR) 20 MG tablet  11/25/14   Historical Provider, MD  meloxicam (MOBIC) 15 MG tablet Reported on 03/26/2015 01/29/15   Historical Provider, MD  ondansetron (ZOFRAN) 4 MG tablet Take 1 tablet (4 mg total) by mouth daily as needed for nausea or vomiting. 03/27/15   Darien Ramus, MD  pantoprazole (PROTONIX) 40 MG tablet  12/04/14   Historical Provider, MD  quinapril (ACCUPRIL) 20 MG tablet  01/12/15   Historical Provider, MD  senna-docusate (SENOKOT-S) 8.6-50 MG tablet Take 2 tablets by mouth daily  as needed for mild constipation. 04/13/15 04/12/16  Anne-Caroline Sharma Covert, MD  tamsulosin [facility]) 0.4 MG CAPS capsule Reported on 03/24/2015 12/15/14   Historical Provider, MD  traMADol (ULTRAM) 50 MG tablet Take 1-2 tablets (50-100 mg total) by mouth every 6 (six) hours as needed for moderate pain. 03/24/15   Crist Fat, MD    Allergies as of 04/24/2015 - Review Complete 04/24/2015  Allergen Reaction Noted  . Aspirin Other (See Comments) 02/16/2015  . Metformin Nausea Only and Nausea And Vomiting 02/16/2015  . Pioglitazone Other (See Comments) 02/16/2015  . Rosiglitazone Other (See Comments) 02/16/2015     Family History  Problem Relation Age of Onset  . Hematuria Neg Hx   . Kidney cancer Neg Hx   . Prostate cancer Neg Hx   . Sickle cell anemia Neg Hx   . Tuberculosis Neg Hx     Social History   Social History  . Marital Status: Widowed    Spouse Name: N/A  . Number of Children: N/A  . Years of Education: N/A   Occupational History  . Not on file.   Social History Main Topics  . Smoking status: Former Games developer  . Smokeless tobacco: Current User    Types: Chew  . Alcohol Use: No  . Drug Use: No  . Sexual Activity: Not on file   Other Topics Concern  . Not on file   Social History Narrative    Review of Systems: See HPI, otherwise negative ROS  Physical Exam: BP 155/66 mmHg  Pulse 71  Temp(Src) 98.3 F (36.8 C) (Oral)  Resp 18  SpO2 100% General:   Alert,  pleasant and cooperative in NAD Head:  Normocephalic and atraumatic. Neck:  Supple; no masses or thyromegaly. Lungs:  Clear throughout to auscultation.    Heart:  Regular rate and rhythm. Abdomen:  Soft, nontender and nondistended. Normal bowel sounds, without guarding, and without rebound.   Neurologic:  Alert and  oriented x4;  grossly normal neurologically.  Impression/Plan: Juan Walker is here for an endoscopy to be performed for abdominal pain  Risks, benefits, limitations, and alternatives regarding  endoscopy have been reviewed with the patient.  Questions have been answered.  All parties agreeable.   Lynnae Prude, MD  04/27/2015, 7:33 AM

## 2015-04-27 NOTE — Telephone Encounter (Signed)
Pt daughter called requesting refill on oxycodone for severe abdominal and back pain.Pt was seen by  Dr.Herrick on 1/31/17and recommended to get a MRI in . He had his MRI done x 4 days ago.  The pt had an Endoscopy today 7am at [facility]. Please Advise  I called back and spoke w/ Arline Asp and advised her per Carollee Herter that since her father just had an endoscopy done today we can't do anything in a outpatient site for 48hrs and he needs to f/u with his Gastroenterologist. Pt daughter verbalize understanding.

## 2015-04-27 NOTE — Transfer of Care (Signed)
Immediate Anesthesia Transfer of Care Note  Patient: Juan Walker  Procedure(s) Performed: Procedure(s): ESOPHAGOGASTRODUODENOSCOPY (EGD) WITH PROPOFOL (N/A)  Patient Location: PACU  Anesthesia Type:General  Level of Consciousness: sedated  Airway & Oxygen Therapy: Patient Spontanous Breathing and Patient connected to nasal cannula oxygen  Post-op Assessment: Report given to RN and Post -op Vital signs reviewed and stable  Post vital signs: Reviewed and stable  Last Vitals:  Filed Vitals:   04/27/15 0658  BP: 155/66  Pulse: 71  Temp: 36.8 C  Resp: 18    Complications: No apparent anesthesia complications

## 2015-04-27 NOTE — Anesthesia Preprocedure Evaluation (Signed)
Anesthesia Evaluation  Patient identified by MRN, date of birth, ID band Patient awake    Reviewed: Allergy & Precautions, NPO status , Patient's Chart, lab work & pertinent test results  Airway Mallampati: III  TM Distance: >3 FB Neck ROM: Limited    Dental  (+) Upper Dentures, Partial Lower   Pulmonary former smoker,    Pulmonary exam normal        Cardiovascular hypertension, Pt. on medications Normal cardiovascular exam     Neuro/Psych Depression    GI/Hepatic diverticulosis   Endo/Other  diabetes, Well Controlled, Type 2Hypothyroidism   Renal/GU Renal cyst   BPH    Musculoskeletal negative musculoskeletal ROS (+)   Abdominal Normal abdominal exam  (+)   Peds negative pediatric ROS (+)  Hematology  (+) anemia ,   Anesthesia Other Findings   Reproductive/Obstetrics                             Anesthesia Physical Anesthesia Plan  ASA: III  Anesthesia Plan: General   Post-op Pain Management:    Induction: Intravenous  Airway Management Planned: Nasal Cannula  Additional Equipment:   Intra-op Plan:   Post-operative Plan:   Informed Consent: I have reviewed the patients History and Physical, chart, labs and discussed the procedure including the risks, benefits and alternatives for the proposed anesthesia with the patient or authorized representative who has indicated his/her understanding and acceptance.   Dental advisory given  Plan Discussed with: CRNA and Surgeon  Anesthesia Plan Comments:         Anesthesia Quick Evaluation

## 2015-04-27 NOTE — Op Note (Signed)
[facility] Gastroenterology Patient Name: Juan Walker Procedure Date: 04/27/2015 7:34 AM MRN: [id number] Account #: [id number] Date of Birth: Jan 21, 1932 Admit Type: Outpatient Age: 82 Room: [facility] ENDO ROOM 1 Gender: Male Note Status: Finalized Procedure:            Upper GI endoscopy Indications:          Epigastric abdominal pain, Abdominal pain in the right                        upper quadrant Providers:            Scot Jun, MD Referring MD:         Marisue Ivan (Referring MD) Medicines:            Propofol per Anesthesia Complications:        No immediate complications. Procedure:            Pre-Anesthesia Assessment:                       - After reviewing the risks and benefits, the patient                        was deemed in satisfactory condition to undergo the                        procedure.                       After obtaining informed consent, the endoscope was                        passed under direct vision. Throughout the procedure,                        the patient's blood pressure, pulse, and oxygen                        saturations were monitored continuously. The Endoscope                        was introduced through the mouth, and advanced to the                        second part of duodenum. The upper GI endoscopy was                        accomplished without difficulty. The patient tolerated                        the procedure well. Findings:      The examined esophagus was normal.      Diffuse and localized mild inflammation characterized by erythema and       granularity was found in the cardia. Biopsies were taken with a cold       forceps for histology.      A single small papule (nodule) with no bleeding and no stigmata of       recent bleeding was found in the cardia. Biopsies were taken with a cold       forceps for histology.      Patchy mildly erythematous mucosa without bleeding was found in  the   gastric antrum. Biopsies were taken with a cold forceps for histology.      The examined duodenum was normal. Impression:           - Normal esophagus.                       - Gastritis. Biopsied.                       - A single papule (nodule) found in the stomach.                        Biopsied.                       - Erythematous mucosa in the antrum. Biopsied.                       - Normal examined duodenum. Recommendation:       - Await pathology results. Scot Jun, MD 04/27/2015 7:50:01 AM This report has been signed electronically. Number of Addenda: 0 Note Initiated On: 04/27/2015 7:34 AM      [facility]

## 2015-04-27 NOTE — Anesthesia Postprocedure Evaluation (Signed)
Anesthesia Post Note  Patient: Juan Walker  Procedure(s) Performed: Procedure(s) (LRB): ESOPHAGOGASTRODUODENOSCOPY (EGD) WITH PROPOFOL (N/A)  Patient location during evaluation: PACU Anesthesia Type: General Level of consciousness: awake and alert and oriented Pain management: pain level controlled Vital Signs Assessment: post-procedure vital signs reviewed and stable Respiratory status: spontaneous breathing Cardiovascular status: blood pressure returned to baseline Anesthetic complications: no    Last Vitals:  Filed Vitals:   04/27/15 0813 04/27/15 0823  BP: 134/56 147/59  Pulse: 64 63  Temp:    Resp: 12 14    Last Pain:  Filed Vitals:   04/27/15 0824  PainSc: 10-Worst pain ever                 [name]

## 2015-04-28 ENCOUNTER — Encounter: Payer: Self-pay | Admitting: Urology

## 2015-04-28 ENCOUNTER — Ambulatory Visit: Payer: PPO | Admitting: Urology

## 2015-04-28 ENCOUNTER — Encounter: Payer: Self-pay | Admitting: Medical Oncology

## 2015-04-28 ENCOUNTER — Emergency Department: Payer: PPO

## 2015-04-28 ENCOUNTER — Emergency Department
Admission: EM | Admit: 2015-04-28 | Discharge: 2015-04-28 | Disposition: A | Discharge status: Home / Self Care | Payer: PPO | Attending: Emergency Medicine | Admitting: Emergency Medicine

## 2015-04-28 DIAGNOSIS — Z79899 Other long term (current) drug therapy: Secondary | ICD-10-CM | POA: Diagnosis not present

## 2015-04-28 DIAGNOSIS — R109 Unspecified abdominal pain: Secondary | ICD-10-CM

## 2015-04-28 DIAGNOSIS — I10 Essential (primary) hypertension: Secondary | ICD-10-CM | POA: Insufficient documentation

## 2015-04-28 DIAGNOSIS — E119 Type 2 diabetes mellitus without complications: Secondary | ICD-10-CM | POA: Insufficient documentation

## 2015-04-28 DIAGNOSIS — Z87891 Personal history of nicotine dependence: Secondary | ICD-10-CM | POA: Insufficient documentation

## 2015-04-28 DIAGNOSIS — K5900 Constipation, unspecified: Secondary | ICD-10-CM | POA: Diagnosis not present

## 2015-04-28 DIAGNOSIS — R1110 Vomiting, unspecified: Secondary | ICD-10-CM | POA: Insufficient documentation

## 2015-04-28 DIAGNOSIS — R1012 Left upper quadrant pain: Secondary | ICD-10-CM | POA: Diagnosis present

## 2015-04-28 DIAGNOSIS — Z792 Long term (current) use of antibiotics: Secondary | ICD-10-CM | POA: Diagnosis not present

## 2015-04-28 DIAGNOSIS — Z794 Long term (current) use of insulin: Secondary | ICD-10-CM | POA: Diagnosis not present

## 2015-04-28 HISTORY — DX: Disorder of kidney and ureter, unspecified: N28.9

## 2015-04-28 LAB — CBC WITH DIFFERENTIAL/PLATELET
BASOS ABS: 0 10*3/uL (ref 0–0.1)
Basophils Relative: 0 %
Eosinophils Absolute: 0 10*3/uL (ref 0–0.7)
Eosinophils Relative: 0 %
HEMATOCRIT: 33.3 % — AB (ref 40.0–52.0)
HEMOGLOBIN: 11.3 g/dL — AB (ref 13.0–18.0)
LYMPHS PCT: 16 %
Lymphs Abs: 1.3 10*3/uL (ref 1.0–3.6)
MCH: 31.4 pg (ref 26.0–34.0)
MCHC: 34 g/dL (ref 32.0–36.0)
MCV: 92.4 fL (ref 80.0–100.0)
MONO ABS: 0.5 10*3/uL (ref 0.2–1.0)
MONOS PCT: 6 %
NEUTROS ABS: 6.3 10*3/uL (ref 1.4–6.5)
NEUTROS PCT: 78 %
Platelets: 277 10*3/uL (ref 150–440)
RBC: 3.6 MIL/uL — ABNORMAL LOW (ref 4.40–5.90)
RDW: 13 % (ref 11.5–14.5)
WBC: 8.1 10*3/uL (ref 3.8–10.6)

## 2015-04-28 LAB — COMPREHENSIVE METABOLIC PANEL
ALBUMIN: 3.1 g/dL — AB (ref 3.5–5.0)
ALT: 13 U/L — AB (ref 17–63)
AST: 18 U/L (ref 15–41)
Alkaline Phosphatase: 101 U/L (ref 38–126)
Anion gap: 8 (ref 5–15)
BUN: 21 mg/dL — AB (ref 6–20)
CHLORIDE: 101 mmol/L (ref 101–111)
CO2: 26 mmol/L (ref 22–32)
CREATININE: 1.27 mg/dL — AB (ref 0.61–1.24)
Calcium: 9 mg/dL (ref 8.9–10.3)
GFR calc Af Amer: 59 mL/min — ABNORMAL LOW (ref >60)
GFR calc non Af Amer: 51 mL/min — ABNORMAL LOW (ref >60)
Glucose, Bld: 91 mg/dL (ref 65–99)
Potassium: 4 mmol/L (ref 3.5–5.1)
SODIUM: 135 mmol/L (ref 135–145)
Total Bilirubin: 0.3 mg/dL (ref 0.3–1.2)
Total Protein: 7.1 g/dL (ref 6.5–8.1)

## 2015-04-28 LAB — URINALYSIS COMPLETE WITH MICROSCOPIC (ARMC ONLY)
BACTERIA UA: NONE SEEN
BILIRUBIN URINE: NEGATIVE
Glucose, UA: NEGATIVE mg/dL
Ketones, ur: NEGATIVE mg/dL
Nitrite: NEGATIVE
PH: 6 (ref 5.0–8.0)
Protein, ur: 30 mg/dL — AB
Specific Gravity, Urine: 1.046 — ABNORMAL HIGH (ref 1.005–1.030)

## 2015-04-28 LAB — TROPONIN I: Troponin I: 0.03 ng/mL (ref <0.031)

## 2015-04-28 LAB — LIPASE, BLOOD: Lipase: 17 U/L (ref 11–51)

## 2015-04-28 MED ORDER — HYDROMORPHONE HCL 1 MG/ML IJ SOLN
0.5000 mg | Freq: Once | INTRAMUSCULAR | Status: AC
  Administered 2015-04-28: 0.5 mg via INTRAVENOUS

## 2015-04-28 MED ORDER — IOHEXOL 240 MG/ML SOLN
25.0000 mL | Freq: Once | INTRAMUSCULAR | Status: AC | PRN
  Administered 2015-04-28: 25 mL via ORAL

## 2015-04-28 MED ORDER — ONDANSETRON HCL 4 MG/2ML IJ SOLN
4.0000 mg | Freq: Once | INTRAMUSCULAR | Status: AC
  Administered 2015-04-28: 4 mg via INTRAVENOUS

## 2015-04-28 MED ORDER — ONDANSETRON HCL 4 MG/2ML IJ SOLN
INTRAMUSCULAR | Status: AC
  Administered 2015-04-28: 4 mg via INTRAVENOUS
  Filled 2015-04-28: qty 2

## 2015-04-28 MED ORDER — HYDROMORPHONE HCL 1 MG/ML IJ SOLN
INTRAMUSCULAR | Status: AC
  Administered 2015-04-28: 0.5 mg via INTRAVENOUS
  Filled 2015-04-28: qty 1

## 2015-04-28 MED ORDER — IOHEXOL 300 MG/ML  SOLN
100.0000 mL | Freq: Once | INTRAMUSCULAR | Status: AC | PRN
  Administered 2015-04-28: 100 mL via INTRAVENOUS

## 2015-04-28 NOTE — ED Provider Notes (Signed)
[facility] Emergency Department Provider Note  ____________________________________________  Time seen: Approximately 10:04 AM  I have reviewed the triage vital signs and the nursing notes.   HISTORY  Chief Complaint Abdominal Pain; Constipation; and Emesis    HPI Juan Walker is a 82 y.o. male patient had an EGD with Dr. Mechele Collin yesterday. He began having a gripping pain in his left upper quadrant of his abdomen last night and this morning. He does not feel like eating. In addition he has been vomiting several times. Patient does not have stool in about 2 weeks. Patient's had an having problems stooling since December when he had some kidney surgery. He's been also taking Percocet since about that time. Patient reports pain is 10 out of 10 think seems to make it better or worse.   Past Medical History  Diagnosis Date  . Essential (primary) hypertension 02/16/2015  . Diverticular disease of large intestine 02/16/2015  . Acquired hypothyroidism 02/16/2015  . Diabetes mellitus (HCC) 02/16/2015    Overview:  insulin 2005 last eye exam 01/2013 [facility]   . Depression   . Glaucoma   . Gout   . Renal disorder   . BPH (benign prostatic hyperplasia)   . BPH (benign prostatic hypertrophy)   . Cataract     Right eye  . Anemia     chronic  . Diverticulosis of colon without hemorrhage   . Gout   . History of prostatitis   . Morbid obesity (HCC)   . Psoriasis   . Shingles   . Renal insufficiency     Patient Active Problem List   Diagnosis Date Noted  . Notalgia 03/29/2015  . Renal cyst, left 03/29/2015  . Left sided abdominal pain 03/29/2015  . Acquired hypothyroidism 02/16/2015  . Benign fibroma of prostate 02/16/2015  . Cataract 02/16/2015  . Chronic anemia 02/16/2015  . Diverticular disease of large intestine 02/16/2015  . Essential (primary) hypertension 02/16/2015  . Gout 02/16/2015  . Morbid obesity (HCC) 02/16/2015  . Psoriasis  02/16/2015  . Diabetes mellitus (HCC) 02/16/2015  . Microhematuria 02/16/2015  . Renal cyst 02/16/2015  . Type 2 diabetes mellitus (HCC) 08/06/2014    Past Surgical History  Procedure Laterality Date  . Nephrolithotomy  1972  . Sinus irrigation    . Excision benign skin lesion ears    . Kidney stones removed      Current Outpatient Rx  Name  Route  Sig  Dispense  Refill  . allopurinol (ZYLOPRIM) 300 MG tablet   Oral   Take 300 mg by mouth daily.          . Alum & Mag Hydroxide-Simeth (GI COCKTAIL) SUSP suspension   Oral   Take 5 mLs by mouth every 4 (four) hours as needed for indigestion. Shake well.         Juan Walker amLODipine (NORVASC) 5 MG tablet      TAKE ONE TABLET EVERY DAY         . B Complex Vitamins (VITAMIN-B COMPLEX) TABS   Oral   Take by mouth.         . cephALEXin (KEFLEX) 500 MG capsule   Oral   Take 500 mg by mouth 4 (four) times daily. Reported on 04/27/2015         . HUMALOG KWIKPEN 100 UNIT/ML KiwkPen   Subcutaneous   Inject 12 Units into the skin 2 (two) times daily with a meal. Reported on 03/26/2015  Dispense as written.   Juan Walker ibuprofen (ADVIL,MOTRIN) 200 MG tablet   Oral   Take 200 mg by mouth every 6 (six) hours as needed. Reported on 03/26/2015         . insulin aspart protamine- aspart (NOVOLOG MIX 70/30) (70-30) 100 UNIT/ML injection   Subcutaneous   Inject 12 Units into the skin 2 (two) times daily with a meal.          . lactulose (CHRONULAC) 10 GM/15ML solution   Oral   Take 45 mLs (30 g total) by mouth daily as needed for severe constipation.   120 mL   0   . LANTUS SOLOSTAR 100 UNIT/ML Solostar Pen   Subcutaneous   Inject 20 Units into the skin at bedtime.            Dispense as written.   Juan Walker levothyroxine (SYNTHROID, LEVOTHROID) 50 MCG tablet   Oral   Take 50 mcg by mouth daily before breakfast. Reported on 03/24/2015         . lovastatin (MEVACOR) 20 MG tablet   Oral   Take 20 mg by mouth at bedtime.           . ondansetron (ZOFRAN) 4 MG tablet   Oral   Take 1 tablet (4 mg total) by mouth daily as needed for nausea or vomiting.   10 tablet   0   . oxyCODONE-acetaminophen (PERCOCET) 10-325 MG tablet   Oral   Take 1 tablet by mouth every 4 (four) hours as needed for pain.   30 tablet   0   . pantoprazole (PROTONIX) 40 MG tablet   Oral   Take 40 mg by mouth 2 (two) times daily.          . quinapril (ACCUPRIL) 20 MG tablet   Oral   Take 20 mg by mouth daily.          Juan Walker senna-docusate (SENOKOT-S) 8.6-50 MG tablet   Oral   Take 2 tablets by mouth daily as needed for mild constipation.   20 tablet   0   . sucralfate (CARAFATE) 1 g tablet   Oral   Take 1 g by mouth 4 (four) times daily -  with meals and at bedtime.         . tamsulosin (FLOMAX) 0.4 MG CAPS capsule   Oral   Take 0.4 mg by mouth daily. Reported on 03/24/2015         . traMADol (ULTRAM) 50 MG tablet   Oral   Take 1-2 tablets (50-100 mg total) by mouth every 6 (six) hours as needed for moderate pain.   50 tablet   0   . meloxicam (MOBIC) 15 MG tablet      Reported on 03/26/2015         . psyllium (REGULOID) 0.52 g capsule   Oral   Take 0.52 g by mouth daily.         Juan Walker saccharomyces boulardii (FLORASTOR) 250 MG capsule   Oral   Take 250 mg by mouth 2 (two) times daily.           Allergies Aspirin; Metformin; Pioglitazone; and Rosiglitazone  Family History  Problem Relation Age of Onset  . Hematuria Neg Hx   . Kidney cancer Neg Hx   . Prostate cancer Neg Hx   . Sickle cell anemia Neg Hx   . Tuberculosis Neg Hx     Social History Social History  Substance Use Topics  .  Smoking status: Former Games developer  . Smokeless tobacco: Current User    Types: Chew  . Alcohol Use: No    Review of Systems Constitutional: No fever/chills Eyes: No visual changes. ENT: No sore throat. Cardiovascular: Denies chest pain. Respiratory: Denies shortness of breath. Gastrointestinal: See  history of present illness Genitourinary: Negative for dysuria. Musculoskeletal: Negative for back pain. Skin: Negative for rash. Neurological: Negative for headaches, focal weakness or numbness.  10-point ROS otherwise negative.  ____________________________________________   PHYSICAL EXAM:  VITAL SIGNS: ED Triage Vitals  Enc Vitals Group     BP 04/28/15 0911 158/68 mmHg     Pulse Rate 04/28/15 0911 89     Resp 04/28/15 0911 20     Temp 04/28/15 0911 98.1 F (36.7 C)     Temp src --      SpO2 04/28/15 0911 97 %     Weight 04/28/15 0911 229 lb (103.874 kg)     Height 04/28/15 0911 5\' 5"  (1.651 m)     Head Cir --      Peak Flow --      Pain Score 04/28/15 0912 9     Pain Loc --      Pain Edu? --      Excl. in GC? --     Constitutional: Alert and oriented. Well appearing and in no acute distress. Eyes: Conjunctivae are normal. PERRL. EOMI. Head: Atraumatic. Nose: No congestion/rhinnorhea. Mouth/Throat: Mucous membranes are moist.  Oropharynx non-erythematous. Neck: No stridor.  Cardiovascular: Normal rate, regular rhythm. Grossly normal heart sounds.  Good peripheral circulation. Respiratory: Normal respiratory effort.  No retractions. Lungs CTAB. Gastrointestinal: Soft tender left upper quadrant and left CVA area to palpation and percussion No distention. No abdominal bruits.  Musculoskeletal: No lower extremity tenderness nor edema.  No joint effusions. Neurologic:  Normal speech and language. No gross focal neurologic deficits are appreciated. No gait instability. Skin:  Skin is warm, dry and intact. No rash noted. Psychiatric: Mood and affect are normal. Speech and behavior are normal.  ____________________________________________   LABS (all labs ordered are listed, but only abnormal results are displayed)  Labs Reviewed  COMPREHENSIVE METABOLIC PANEL - Abnormal; Notable for the following:    BUN 21 (*)    Creatinine, Ser 1.27 (*)    Albumin 3.1 (*)    ALT  13 (*)    GFR calc non Af Amer 51 (*)    GFR calc Af Amer 59 (*)    All other components within normal limits  CBC WITH DIFFERENTIAL/PLATELET - Abnormal; Notable for the following:    RBC 3.60 (*)    Hemoglobin 11.3 (*)    HCT 33.3 (*)    All other components within normal limits  URINALYSIS COMPLETEWITH MICROSCOPIC ([facility] ONLY) - Abnormal; Notable for the following:    Color, Urine YELLOW (*)    APPearance HAZY (*)    Specific Gravity, Urine 1.046 (*)    Hgb urine dipstick 1+ (*)    Protein, ur 30 (*)    Leukocytes, UA 2+ (*)    Squamous Epithelial / LPF 0-5 (*)    All other components within normal limits  LIPASE, BLOOD  TROPONIN I  TROPONIN I   ____________________________________________  EKG  EKG read and interpreted by me shows normal sinus rhythm rate of 83 left axis no acute ST-T wave changes ____________________________________________  RADIOLOGY  DT read by radiology as no acute disease only last stool in the colon this is the fifth time  I dictated this ____________________________________________   PROCEDURES   ____________________________________________   INITIAL IMPRESSION / ASSESSMENT AND PLAN / ED COURSE  Pertinent labs & imaging results that were available during my care of the patient were reviewed by me and considered in my medical decision making (see chart for details).  Discussed in detail with Dr. Marva Panda he recommends trying some Augmentin after he looked at the CT himself he was concerned that there may be one small area of inflammation in the colon he recommended tap water enemas and then MiraLAX. The patient reported good results after the second tap water enema and said he felt somewhat better. ____________________________________________   FINAL CLINICAL IMPRESSION(S) / ED DIAGNOSES  Final diagnoses:  Constipation, unspecified constipation type  Abdominal pain, unspecified abdominal location      Arnaldo Natal, MD 04/28/15  [phone]

## 2015-04-28 NOTE — Discharge Instructions (Signed)
Abdominal Pain, Adult Many things can cause abdominal pain. Usually, abdominal pain is not caused by a disease and will improve without treatment. It can often be observed and treated at home. Your health care provider will do a physical exam and possibly order blood tests and X-rays to help determine the seriousness of your pain. However, in many cases, more time must pass before a clear cause of the pain can be found. Before that point, your health care provider may not know if you need more testing or further treatment. HOME CARE INSTRUCTIONS Monitor your abdominal pain for any changes. The following actions may help to alleviate any discomfort you are experiencing:  Only take over-the-counter or prescription medicines as directed by your health care provider.  Do not take laxatives unless directed to do so by your health care provider.  Try a clear liquid diet (broth, tea, or water) as directed by your health care provider. Slowly move to a bland diet as tolerated. SEEK MEDICAL CARE IF:  You have unexplained abdominal pain.  You have abdominal pain associated with nausea or diarrhea.  You have pain when you urinate or have a bowel movement.  You experience abdominal pain that wakes you in the night.  You have abdominal pain that is worsened or improved by eating food.  You have abdominal pain that is worsened with eating fatty foods.  You have a fever. SEEK IMMEDIATE MEDICAL CARE IF:  Your pain does not go away within 2 hours.  You keep throwing up (vomiting).  Your pain is felt only in portions of the abdomen, such as the right side or the left lower portion of the abdomen.  You pass bloody or black tarry stools. MAKE SURE YOU:  Understand these instructions.  Will watch your condition.  Will get help right away if you are not doing well or get worse.   This information is not intended to replace advice given to you by your health care provider. Make sure you discuss  any questions you have with your health care provider.   Document Released: 11/24/2004 Document Revised: 11/05/2014 Document Reviewed: 10/24/2012 Elsevier Interactive Patient Education [employer]  I talked to Dr. Marva Panda about you. He suggested we give you Augmentin 1 pill twice a day. Also we will give him MiraLAX twice a day in an attempt to help you evacuate her colon. Please return here for worse pain fever vomiting or feeling sicker. I will give you a little bit of Zofran if he needed if you get a little bit nauseated. Zofran is felt on your tongue 13 times a day as needed.

## 2015-04-28 NOTE — ED Notes (Signed)
Tap water enema done, assisted by Lafonda Mosses, EDT.  Pt received enema and used bedside commode right after completion of bag.

## 2015-04-28 NOTE — Progress Notes (Signed)
Patient complains 10/10 pain.  He states "they took away my oxycodone after my CT Scan"  [facility] contacted.  Morrie Sheldon to contact patient immediately.  Shon Hough, RN

## 2015-04-28 NOTE — ED Notes (Signed)
Patient transported to X-ray 

## 2015-04-28 NOTE — ED Notes (Signed)
Pt here with c/o not having a BM in 2 weeks has been following up with Dr Markham Jordan and had EGD yesterday. Pt reports that over night he began having severe left sided abd pain and is unable to keep any liquids/food down.

## 2015-04-29 ENCOUNTER — Encounter: Payer: Self-pay | Admitting: Unknown Physician Specialty

## 2015-04-29 ENCOUNTER — Telehealth: Payer: Self-pay

## 2015-04-29 LAB — SURGICAL PATHOLOGY

## 2015-04-29 NOTE — Telephone Encounter (Signed)
Spoke with pt in reference to missed appt. Pt stated that he was back in the hospital due to impaction. Reinforced with pt he really needs to reschedule to get MRI results. Pt voiced understanding. Pt was transferred to the front to make f/u appt with Dr. Marlou Porch.

## 2015-04-29 NOTE — Telephone Encounter (Signed)
-----   Message from Crist Fat, MD sent at 04/29/2015  4:57 AM EST ----- Regarding: f/u This patient needs follow-up rescheduled for discussion of his MRI.  He missed yesterday's appointment.  I'm happy to see him if I have time... Please reschedule him and give him a call to let him know. Thank you, ben

## 2015-05-05 ENCOUNTER — Ambulatory Visit (INDEPENDENT_AMBULATORY_CARE_PROVIDER_SITE_OTHER): Payer: PPO | Admitting: Urology

## 2015-05-05 ENCOUNTER — Encounter: Payer: Self-pay | Admitting: Urology

## 2015-05-05 VITALS — BP 146/76 | HR 97 | Resp 16 | Ht 65.0 in | Wt 222.0 lb

## 2015-05-05 DIAGNOSIS — M549 Dorsalgia, unspecified: Secondary | ICD-10-CM

## 2015-05-05 DIAGNOSIS — R109 Unspecified abdominal pain: Secondary | ICD-10-CM

## 2015-05-05 MED ORDER — DIAZEPAM 5 MG PO TABS: 5.0000 mg | ORAL_TABLET | Freq: Four times a day (QID) | ORAL | Status: DC | PRN

## 2015-05-05 NOTE — Progress Notes (Signed)
82 year old male who returns today for   ongoing evaluation of his left back an lateral abdominal pain. The patient underwent a cyst aspiration approximately  In late December for this issue , his issue was made worse. He is been taking ibuprofen and other NSAIDs to help treat his pain.  I ordered an ultrasound at his last visit to ensure that the patient did not have a significant hematoma. The cyst was simple appearing although there was a dense material within the cysts, likely hemorrhage.  The patient has been in and out of the emergency department where he is undergone a repeat CT scan which is largely unremarkable. The patient was noted to be completely constipated and has had very difficult times evacuating his stool. In the ER, the patient did get several enemas and then had a very large bowel movement. However, the patient continues to struggle with bowel movements, is not had a bowel movement in several days.  The patient states that his pain is worse in the evening. It is improved with hot compresses a heating pad. His pain is located in the epigastric region as well as the left flank.   The patient's voiding symptoms have improved since starting the antibiotic. He denies any hematuria. He denies any fevers or chills. He is having urinary frequency, urgency, and worsening..   Prior to the patient undergoing the CT scan, I did order an MRI to characterize the cyst were completely. Today, the patient and I we went over the results.  Current Outpatient Prescriptions on File Prior to Visit  Medication Sig Dispense Refill  . allopurinol (ZYLOPRIM) 300 MG tablet Take 300 mg by mouth daily.     . Alum & Mag Hydroxide-Simeth (GI COCKTAIL) SUSP suspension Take 5 mLs by mouth every 4 (four) hours as needed for indigestion. Shake well.    Marland Kitchen. amLODipine (NORVASC) 5 MG tablet TAKE ONE TABLET EVERY DAY    . B Complex Vitamins (VITAMIN-B COMPLEX) TABS Take by mouth.    Marland Kitchen. HUMALOG KWIKPEN 100 UNIT/ML KiwkPen  Inject 12 Units into the skin 2 (two) times daily with a meal. Reported on 03/26/2015    . ibuprofen (ADVIL,MOTRIN) 200 MG tablet Take 200 mg by mouth every 6 (six) hours as needed. Reported on 03/26/2015    . insulin aspart protamine- aspart (NOVOLOG MIX 70/30) (70-30) 100 UNIT/ML injection Inject 12 Units into the skin 2 (two) times daily with a meal.     . lactulose (CHRONULAC) 10 GM/15ML solution Take 45 mLs (30 g total) by mouth daily as needed for severe constipation. 120 mL 0  . LANTUS SOLOSTAR 100 UNIT/ML Solostar Pen Inject 20 Units into the skin at bedtime.     Marland Kitchen. levothyroxine (SYNTHROID, LEVOTHROID) 50 MCG tablet Take 50 mcg by mouth daily before breakfast. Reported on 03/24/2015    . lovastatin (MEVACOR) 20 MG tablet Take 20 mg by mouth at bedtime.     . meloxicam (MOBIC) 15 MG tablet Reported on 03/26/2015    . ondansetron (ZOFRAN) 4 MG tablet Take 1 tablet (4 mg total) by mouth daily as needed for nausea or vomiting. 10 tablet 0  . oxyCODONE-acetaminophen (PERCOCET) 10-325 MG tablet Take 1 tablet by mouth every 4 (four) hours as needed for pain. 30 tablet 0  . pantoprazole (PROTONIX) 40 MG tablet Take 40 mg by mouth 2 (two) times daily.     . psyllium (REGULOID) 0.52 g capsule Take 0.52 g by mouth daily.    . quinapril (ACCUPRIL)  20 MG tablet Take 20 mg by mouth daily.     Marland Kitchen saccharomyces boulardii (FLORASTOR) 250 MG capsule Take 250 mg by mouth 2 (two) times daily.    Marland Kitchen senna-docusate (SENOKOT-S) 8.6-50 MG tablet Take 2 tablets by mouth daily as needed for mild constipation. 20 tablet 0  . sucralfate (CARAFATE) 1 g tablet Take 1 g by mouth 4 (four) times daily -  with meals and at bedtime.    . tamsulosin (FLOMAX) 0.4 MG CAPS capsule Take 0.4 mg by mouth daily. Reported on 03/24/2015    . traMADol (ULTRAM) 50 MG tablet Take 1-2 tablets (50-100 mg total) by mouth every 6 (six) hours as needed for moderate pain. 50 tablet 0  . cephALEXin (KEFLEX) 500 MG capsule Take 500 mg by mouth 4 (four)  times daily. Reported on 04/27/2015     No current facility-administered medications on file prior to visit.   Past Medical History  Diagnosis Date  . Essential (primary) hypertension 02/16/2015  . Diverticular disease of large intestine 02/16/2015  . Acquired hypothyroidism 02/16/2015  . Diabetes mellitus (HCC) 02/16/2015    Overview:  insulin 2005 last eye exam 01/2013 [facility]   . Depression   . Glaucoma   . Gout   . Renal disorder   . BPH (benign prostatic hyperplasia)   . BPH (benign prostatic hypertrophy)   . Cataract     Right eye  . Anemia     chronic  . Diverticulosis of colon without hemorrhage   . Gout   . History of prostatitis   . Morbid obesity (HCC)   . Psoriasis   . Shingles   . Renal insufficiency    Exam: Filed Vitals:   05/05/15 1142  BP: 146/76  Pulse: 97  Resp: 16   NAD Slight left CVA tenderness with point tenderness just over the iliac crest No edema of lower extremity pain   Imaging:  The MRI demonstrates a complex cyst that is hyperdense on the left, but likely a Bosniak 2 or 34F cyst.   In addition, there was then area in the pancreas that was concerning for IPMN and recommended repeat MRI in 6 months.  Impression: the patient has ongoing left sided back and abdominal pain.   Again, I reiterated that this is not renal in origin. I think is largely musculoskeletal in nature and may also be associated with his profound constipation.    Discussion:  I have given the patient an aggressive bowel regimen that includes magnesium citrate. I recommended that he continue on this regimen until he has a satisfactory outcome meaning that he has evacuated his entire colon. At that point, the patient should then continue to take stool softeners and mirror lax to ensure that he does not  We develop constipation. I've also given the patient some Valium to help relax the muscles in the left flank region. I suspect this is musculoskeletal in nature. The  patient would likely benefit from physical therapy , but we will try a bowel clean out initially.

## 2015-05-13 ENCOUNTER — Telehealth: Payer: Self-pay | Admitting: *Deleted

## 2015-05-13 NOTE — Telephone Encounter (Signed)
LM INFORMED THE PT THAT I WAS CALLING TO GET HIM SCHEDULED. I GAVE MY NAME AND NUMBER AND ASKED THE PT TO PLEASE RETURN MY CALL...TD 

## 2015-05-16 ENCOUNTER — Observation Stay (HOSPITAL_BASED_OUTPATIENT_CLINIC_OR_DEPARTMENT_OTHER)
Admit: 2015-05-16 | Discharge: 2015-05-16 | Disposition: A | Discharge status: Home / Self Care | Payer: PPO | Attending: Internal Medicine | Admitting: Internal Medicine

## 2015-05-16 ENCOUNTER — Encounter: Payer: Self-pay | Admitting: Emergency Medicine

## 2015-05-16 ENCOUNTER — Emergency Department: Payer: PPO

## 2015-05-16 ENCOUNTER — Observation Stay: Payer: PPO

## 2015-05-16 ENCOUNTER — Observation Stay
Admission: EM | Admit: 2015-05-16 | Discharge: 2015-05-21 | Disposition: A | Discharge status: Home / Self Care | Payer: PPO | Attending: Internal Medicine | Admitting: Internal Medicine

## 2015-05-16 DIAGNOSIS — W11XXXA Fall on and from ladder, initial encounter: Secondary | ICD-10-CM | POA: Insufficient documentation

## 2015-05-16 DIAGNOSIS — Z6836 Body mass index (BMI) 36.0-36.9, adult: Secondary | ICD-10-CM | POA: Diagnosis not present

## 2015-05-16 DIAGNOSIS — E785 Hyperlipidemia, unspecified: Secondary | ICD-10-CM | POA: Insufficient documentation

## 2015-05-16 DIAGNOSIS — L409 Psoriasis, unspecified: Secondary | ICD-10-CM | POA: Insufficient documentation

## 2015-05-16 DIAGNOSIS — Z886 Allergy status to analgesic agent status: Secondary | ICD-10-CM | POA: Insufficient documentation

## 2015-05-16 DIAGNOSIS — I129 Hypertensive chronic kidney disease with stage 1 through stage 4 chronic kidney disease, or unspecified chronic kidney disease: Secondary | ICD-10-CM | POA: Diagnosis not present

## 2015-05-16 DIAGNOSIS — R0602 Shortness of breath: Secondary | ICD-10-CM | POA: Insufficient documentation

## 2015-05-16 DIAGNOSIS — Z87442 Personal history of urinary calculi: Secondary | ICD-10-CM | POA: Diagnosis not present

## 2015-05-16 DIAGNOSIS — Z79899 Other long term (current) drug therapy: Secondary | ICD-10-CM | POA: Diagnosis not present

## 2015-05-16 DIAGNOSIS — E871 Hypo-osmolality and hyponatremia: Secondary | ICD-10-CM | POA: Diagnosis not present

## 2015-05-16 DIAGNOSIS — I4891 Unspecified atrial fibrillation: Secondary | ICD-10-CM | POA: Diagnosis present

## 2015-05-16 DIAGNOSIS — N182 Chronic kidney disease, stage 2 (mild): Secondary | ICD-10-CM | POA: Insufficient documentation

## 2015-05-16 DIAGNOSIS — K5900 Constipation, unspecified: Secondary | ICD-10-CM | POA: Diagnosis not present

## 2015-05-16 DIAGNOSIS — R55 Syncope and collapse: Secondary | ICD-10-CM

## 2015-05-16 DIAGNOSIS — R109 Unspecified abdominal pain: Secondary | ICD-10-CM | POA: Diagnosis not present

## 2015-05-16 DIAGNOSIS — E039 Hypothyroidism, unspecified: Secondary | ICD-10-CM | POA: Insufficient documentation

## 2015-05-16 DIAGNOSIS — E1122 Type 2 diabetes mellitus with diabetic chronic kidney disease: Secondary | ICD-10-CM | POA: Insufficient documentation

## 2015-05-16 DIAGNOSIS — Z794 Long term (current) use of insulin: Secondary | ICD-10-CM | POA: Diagnosis not present

## 2015-05-16 DIAGNOSIS — Z888 Allergy status to other drugs, medicaments and biological substances status: Secondary | ICD-10-CM | POA: Diagnosis not present

## 2015-05-16 DIAGNOSIS — Z9889 Other specified postprocedural states: Secondary | ICD-10-CM | POA: Diagnosis not present

## 2015-05-16 DIAGNOSIS — R002 Palpitations: Secondary | ICD-10-CM | POA: Diagnosis not present

## 2015-05-16 DIAGNOSIS — I471 Supraventricular tachycardia, unspecified: Secondary | ICD-10-CM

## 2015-05-16 DIAGNOSIS — R42 Dizziness and giddiness: Secondary | ICD-10-CM | POA: Insufficient documentation

## 2015-05-16 DIAGNOSIS — I441 Atrioventricular block, second degree: Secondary | ICD-10-CM | POA: Insufficient documentation

## 2015-05-16 DIAGNOSIS — K219 Gastro-esophageal reflux disease without esophagitis: Secondary | ICD-10-CM | POA: Diagnosis not present

## 2015-05-16 DIAGNOSIS — I959 Hypotension, unspecified: Secondary | ICD-10-CM | POA: Insufficient documentation

## 2015-05-16 DIAGNOSIS — M109 Gout, unspecified: Secondary | ICD-10-CM | POA: Diagnosis not present

## 2015-05-16 DIAGNOSIS — K589 Irritable bowel syndrome without diarrhea: Secondary | ICD-10-CM | POA: Insufficient documentation

## 2015-05-16 DIAGNOSIS — I7389 Other specified peripheral vascular diseases: Secondary | ICD-10-CM | POA: Diagnosis not present

## 2015-05-16 DIAGNOSIS — F329 Major depressive disorder, single episode, unspecified: Secondary | ICD-10-CM | POA: Diagnosis not present

## 2015-05-16 DIAGNOSIS — R197 Diarrhea, unspecified: Secondary | ICD-10-CM

## 2015-05-16 DIAGNOSIS — R296 Repeated falls: Secondary | ICD-10-CM | POA: Insufficient documentation

## 2015-05-16 DIAGNOSIS — Z720 Tobacco use: Secondary | ICD-10-CM | POA: Insufficient documentation

## 2015-05-16 DIAGNOSIS — M25512 Pain in left shoulder: Secondary | ICD-10-CM | POA: Diagnosis not present

## 2015-05-16 DIAGNOSIS — R531 Weakness: Secondary | ICD-10-CM | POA: Diagnosis not present

## 2015-05-16 DIAGNOSIS — R634 Abnormal weight loss: Secondary | ICD-10-CM | POA: Diagnosis not present

## 2015-05-16 DIAGNOSIS — M25522 Pain in left elbow: Secondary | ICD-10-CM | POA: Diagnosis not present

## 2015-05-16 DIAGNOSIS — E870 Hyperosmolality and hypernatremia: Secondary | ICD-10-CM | POA: Insufficient documentation

## 2015-05-16 DIAGNOSIS — N281 Cyst of kidney, acquired: Secondary | ICD-10-CM | POA: Diagnosis not present

## 2015-05-16 DIAGNOSIS — N400 Enlarged prostate without lower urinary tract symptoms: Secondary | ICD-10-CM | POA: Diagnosis not present

## 2015-05-16 DIAGNOSIS — M19012 Primary osteoarthritis, left shoulder: Secondary | ICD-10-CM | POA: Insufficient documentation

## 2015-05-16 DIAGNOSIS — Z791 Long term (current) use of non-steroidal anti-inflammatories (NSAID): Secondary | ICD-10-CM | POA: Insufficient documentation

## 2015-05-16 DIAGNOSIS — D649 Anemia, unspecified: Secondary | ICD-10-CM | POA: Insufficient documentation

## 2015-05-16 LAB — CBC
HCT: 35.4 % — ABNORMAL LOW (ref 40.0–52.0)
Hemoglobin: 11.6 g/dL — ABNORMAL LOW (ref 13.0–18.0)
MCH: 30.3 pg (ref 26.0–34.0)
MCHC: 32.9 g/dL (ref 32.0–36.0)
MCV: 91.9 fL (ref 80.0–100.0)
PLATELETS: 348 10*3/uL (ref 150–440)
RBC: 3.85 MIL/uL — ABNORMAL LOW (ref 4.40–5.90)
RDW: 13.1 % (ref 11.5–14.5)
WBC: 9.3 10*3/uL (ref 3.8–10.6)

## 2015-05-16 LAB — COMPREHENSIVE METABOLIC PANEL
ALBUMIN: 2.8 g/dL — AB (ref 3.5–5.0)
ALT: 32 U/L (ref 17–63)
ANION GAP: 12 (ref 5–15)
AST: 48 U/L — AB (ref 15–41)
Alkaline Phosphatase: 101 U/L (ref 38–126)
BILIRUBIN TOTAL: 0.5 mg/dL (ref 0.3–1.2)
BUN: 23 mg/dL — AB (ref 6–20)
CHLORIDE: 101 mmol/L (ref 101–111)
CO2: 20 mmol/L — ABNORMAL LOW (ref 22–32)
Calcium: 8.3 mg/dL — ABNORMAL LOW (ref 8.9–10.3)
Creatinine, Ser: 1.77 mg/dL — ABNORMAL HIGH (ref 0.61–1.24)
GFR calc Af Amer: 40 mL/min — ABNORMAL LOW (ref >60)
GFR calc non Af Amer: 34 mL/min — ABNORMAL LOW (ref >60)
GLUCOSE: 192 mg/dL — AB (ref 65–99)
POTASSIUM: 4.2 mmol/L (ref 3.5–5.1)
Sodium: 133 mmol/L — ABNORMAL LOW (ref 135–145)
TOTAL PROTEIN: 7.1 g/dL (ref 6.5–8.1)

## 2015-05-16 LAB — TROPONIN I
TROPONIN I: 0.03 ng/mL (ref <0.031)
Troponin I: 0.03 ng/mL (ref <0.031)

## 2015-05-16 LAB — PROTIME-INR
INR: 1.29
Prothrombin Time: 16.2 seconds — ABNORMAL HIGH (ref 11.4–15.0)

## 2015-05-16 LAB — APTT: APTT: 34 s (ref 24–36)

## 2015-05-16 LAB — GLUCOSE, CAPILLARY
GLUCOSE-CAPILLARY: 130 mg/dL — AB (ref 65–99)
Glucose-Capillary: 159 mg/dL — ABNORMAL HIGH (ref 65–99)

## 2015-05-16 LAB — ECHOCARDIOGRAM COMPLETE
Height: 65 in
Weight: 3417.6 oz

## 2015-05-16 MED ORDER — MAGNESIUM SULFATE 2 GM/50ML IV SOLN
2.0000 g | Freq: Once | INTRAVENOUS | Status: AC
  Administered 2015-05-16: 2 g via INTRAVENOUS
  Filled 2015-05-16: qty 50

## 2015-05-16 MED ORDER — LEVOTHYROXINE SODIUM 25 MCG PO TABS
50.0000 ug | ORAL_TABLET | Freq: Every day | ORAL | Status: DC
  Administered 2015-05-17 – 2015-05-21 (×5): 50 ug via ORAL
  Filled 2015-05-16 (×5): qty 2

## 2015-05-16 MED ORDER — OXYCODONE-ACETAMINOPHEN 5-325 MG PO TABS
1.0000 | ORAL_TABLET | ORAL | Status: DC | PRN
  Administered 2015-05-20: 1 via ORAL
  Filled 2015-05-16: qty 1

## 2015-05-16 MED ORDER — MORPHINE SULFATE (PF) 2 MG/ML IV SOLN
2.0000 mg | Freq: Once | INTRAVENOUS | Status: AC
  Administered 2015-05-16: 2 mg via INTRAVENOUS
  Filled 2015-05-16: qty 1

## 2015-05-16 MED ORDER — OXYCODONE-ACETAMINOPHEN 10-325 MG PO TABS: 1.0000 | ORAL_TABLET | ORAL | Status: DC | PRN

## 2015-05-16 MED ORDER — DILTIAZEM HCL 30 MG PO TABS
30.0000 mg | ORAL_TABLET | Freq: Four times a day (QID) | ORAL | Status: DC
  Administered 2015-05-16 – 2015-05-20 (×14): 30 mg via ORAL
  Filled 2015-05-16 (×15): qty 1

## 2015-05-16 MED ORDER — MORPHINE SULFATE (PF) 2 MG/ML IV SOLN
2.0000 mg | INTRAVENOUS | Status: DC | PRN
  Administered 2015-05-20: 2 mg via INTRAVENOUS
  Filled 2015-05-16: qty 1

## 2015-05-16 MED ORDER — DILTIAZEM HCL 25 MG/5ML IV SOLN
20.0000 mg | Freq: Once | INTRAVENOUS | Status: AC
  Administered 2015-05-16: 20 mg via INTRAVENOUS

## 2015-05-16 MED ORDER — QUINAPRIL HCL 10 MG PO TABS: 20.0000 mg | ORAL_TABLET | Freq: Every day | ORAL | Status: DC

## 2015-05-16 MED ORDER — BISACODYL 10 MG RE SUPP
10.0000 mg | Freq: Every day | RECTAL | Status: DC | PRN
  Administered 2015-05-19: 10 mg via RECTAL
  Filled 2015-05-16: qty 1

## 2015-05-16 MED ORDER — INSULIN ASPART 100 UNIT/ML ~~LOC~~ SOLN
0.0000 [IU] | Freq: Three times a day (TID) | SUBCUTANEOUS | Status: DC
  Administered 2015-05-17: 2 [IU] via SUBCUTANEOUS
  Administered 2015-05-17 – 2015-05-20 (×5): 1 [IU] via SUBCUTANEOUS
  Filled 2015-05-16 (×4): qty 1
  Filled 2015-05-16: qty 2
  Filled 2015-05-16: qty 1

## 2015-05-16 MED ORDER — PANTOPRAZOLE SODIUM 40 MG PO TBEC
40.0000 mg | DELAYED_RELEASE_TABLET | Freq: Two times a day (BID) | ORAL | Status: DC
  Administered 2015-05-16 – 2015-05-21 (×10): 40 mg via ORAL
  Filled 2015-05-16 (×10): qty 1

## 2015-05-16 MED ORDER — TAMSULOSIN HCL 0.4 MG PO CAPS
0.4000 mg | ORAL_CAPSULE | Freq: Every day | ORAL | Status: DC
  Administered 2015-05-16 – 2015-05-21 (×6): 0.4 mg via ORAL
  Filled 2015-05-16 (×6): qty 1

## 2015-05-16 MED ORDER — ONDANSETRON HCL 4 MG/2ML IJ SOLN
4.0000 mg | Freq: Four times a day (QID) | INTRAMUSCULAR | Status: DC | PRN
  Administered 2015-05-20: 4 mg via INTRAVENOUS
  Filled 2015-05-16: qty 2

## 2015-05-16 MED ORDER — ONDANSETRON HCL 4 MG/2ML IJ SOLN
4.0000 mg | Freq: Once | INTRAMUSCULAR | Status: AC
  Administered 2015-05-16: 4 mg via INTRAVENOUS
  Filled 2015-05-16: qty 2

## 2015-05-16 MED ORDER — SODIUM CHLORIDE 0.9% FLUSH
3.0000 mL | Freq: Two times a day (BID) | INTRAVENOUS | Status: DC
Start: 2015-05-16 — End: 2015-05-21
  Administered 2015-05-16 – 2015-05-21 (×6): 3 mL via INTRAVENOUS

## 2015-05-16 MED ORDER — ALLOPURINOL 300 MG PO TABS
300.0000 mg | ORAL_TABLET | Freq: Every day | ORAL | Status: DC
  Administered 2015-05-16 – 2015-05-21 (×6): 300 mg via ORAL
  Filled 2015-05-16 (×6): qty 1

## 2015-05-16 MED ORDER — ONDANSETRON HCL 4 MG PO TABS: 4.0000 mg | ORAL_TABLET | Freq: Four times a day (QID) | ORAL | Status: DC | PRN

## 2015-05-16 MED ORDER — SACCHAROMYCES BOULARDII 250 MG PO CAPS
250.0000 mg | ORAL_CAPSULE | Freq: Two times a day (BID) | ORAL | Status: DC
  Administered 2015-05-16 – 2015-05-21 (×10): 250 mg via ORAL
  Filled 2015-05-16 (×12): qty 1

## 2015-05-16 MED ORDER — PRAVASTATIN SODIUM 20 MG PO TABS
20.0000 mg | ORAL_TABLET | Freq: Every day | ORAL | Status: DC
  Administered 2015-05-16 – 2015-05-20 (×5): 20 mg via ORAL
  Filled 2015-05-16 (×5): qty 1

## 2015-05-16 MED ORDER — SUCRALFATE 1 G PO TABS
1.0000 g | ORAL_TABLET | Freq: Two times a day (BID) | ORAL | Status: DC
  Administered 2015-05-16 – 2015-05-20 (×8): 1 g via ORAL
  Filled 2015-05-16 (×8): qty 1

## 2015-05-16 MED ORDER — DIAZEPAM 5 MG PO TABS
5.0000 mg | ORAL_TABLET | Freq: Four times a day (QID) | ORAL | Status: DC | PRN
Start: 2015-05-16 — End: 2015-05-21

## 2015-05-16 MED ORDER — INSULIN GLARGINE 100 UNIT/ML ~~LOC~~ SOLN
20.0000 [IU] | Freq: Every day | SUBCUTANEOUS | Status: DC
  Administered 2015-05-16 – 2015-05-19 (×3): 20 [IU] via SUBCUTANEOUS
  Filled 2015-05-16 (×5): qty 0.2

## 2015-05-16 MED ORDER — ACETAMINOPHEN 650 MG RE SUPP: 650.0000 mg | Freq: Four times a day (QID) | RECTAL | Status: DC | PRN

## 2015-05-16 MED ORDER — LISINOPRIL 20 MG PO TABS
20.0000 mg | ORAL_TABLET | Freq: Every day | ORAL | Status: DC
  Administered 2015-05-16 – 2015-05-21 (×6): 20 mg via ORAL
  Filled 2015-05-16 (×6): qty 1

## 2015-05-16 MED ORDER — SODIUM CHLORIDE 0.9 % IV SOLN
INTRAVENOUS | Status: DC
  Administered 2015-05-16 – 2015-05-20 (×4): via INTRAVENOUS

## 2015-05-16 MED ORDER — DILTIAZEM HCL 25 MG/5ML IV SOLN
INTRAVENOUS | Status: AC
  Administered 2015-05-16: 20 mg via INTRAVENOUS
  Filled 2015-05-16: qty 5

## 2015-05-16 MED ORDER — ACETAMINOPHEN 325 MG PO TABS
650.0000 mg | ORAL_TABLET | Freq: Four times a day (QID) | ORAL | Status: DC | PRN
  Administered 2015-05-18: 650 mg via ORAL
  Filled 2015-05-16: qty 2

## 2015-05-16 MED ORDER — OXYCODONE HCL 5 MG PO TABS
5.0000 mg | ORAL_TABLET | ORAL | Status: DC | PRN
  Administered 2015-05-17 – 2015-05-19 (×5): 5 mg via ORAL
  Filled 2015-05-16 (×5): qty 1

## 2015-05-16 MED ORDER — HEPARIN SODIUM (PORCINE) 5000 UNIT/ML IJ SOLN
5000.0000 [IU] | Freq: Three times a day (TID) | INTRAMUSCULAR | Status: DC
  Administered 2015-05-16 – 2015-05-21 (×13): 5000 [IU] via SUBCUTANEOUS
  Filled 2015-05-16 (×15): qty 1

## 2015-05-16 NOTE — ED Notes (Signed)
Patient transported to CT 

## 2015-05-16 NOTE — ED Notes (Signed)
Report given to Stephen, RN 

## 2015-05-16 NOTE — ED Notes (Addendum)
Pt to ed with c/o sycopal episode today.  Pt in rapid a fib hr 148.  Pt denies history of same.  Pt states he fell after syncopal episode and landed on left shoulder,  Pt reports pain and decreased ROM in left shoulder. Per ems pt blood pressure was low in route to hospital.

## 2015-05-16 NOTE — ED Provider Notes (Signed)
[facility] Emergency Department Provider Note  ____________________________________________    I have reviewed the triage vital signs and the nursing notes.   HISTORY  Chief Complaint Tachycardia    HPI Juan Walker is a 82 y.o. male who presents after a syncopal episode. Patient reports that he became dizzy and fell and struck his head and left shoulder. He denies neuro deficits or weakness in his extremities. He reports similar syncopal episode the day prior. He denies chest pain or palpitations. He has been having abdominal pain for some time and is working with gastroenterology in [place]. No recent travel or calf pain or swelling.     Past Medical History  Diagnosis Date  . Essential (primary) hypertension 02/16/2015  . Diverticular disease of large intestine 02/16/2015  . Acquired hypothyroidism 02/16/2015  . Diabetes mellitus (HCC) 02/16/2015    Overview:  insulin 2005 last eye exam 01/2013 [facility]   . Depression   . Glaucoma   . Gout   . Renal disorder   . BPH (benign prostatic hyperplasia)   . BPH (benign prostatic hypertrophy)   . Cataract     Right eye  . Anemia     chronic  . Diverticulosis of colon without hemorrhage   . Gout   . History of prostatitis   . Morbid obesity (HCC)   . Psoriasis   . Shingles   . Renal insufficiency     Patient Active Problem List   Diagnosis Date Noted  . Notalgia 03/29/2015  . Renal cyst, left 03/29/2015  . Left sided abdominal pain 03/29/2015  . Acquired hypothyroidism 02/16/2015  . Benign fibroma of prostate 02/16/2015  . Cataract 02/16/2015  . Chronic anemia 02/16/2015  . Diverticular disease of large intestine 02/16/2015  . Essential (primary) hypertension 02/16/2015  . Gout 02/16/2015  . Morbid obesity (HCC) 02/16/2015  . Psoriasis 02/16/2015  . Diabetes mellitus (HCC) 02/16/2015  . Microhematuria 02/16/2015  . Renal cyst 02/16/2015  . Type 2 diabetes mellitus (HCC)  08/06/2014    Past Surgical History  Procedure Laterality Date  . Nephrolithotomy  1972  . Sinus irrigation    . Excision benign skin lesion ears    . Kidney stones removed    . Esophagogastroduodenoscopy (egd) with propofol N/A 04/27/2015    Procedure: ESOPHAGOGASTRODUODENOSCOPY (EGD) WITH PROPOFOL;  Surgeon: Scot Jun, MD;  Location: [facility] ENDOSCOPY;  Service: Endoscopy;  Laterality: N/A;    Current Outpatient Rx  Name  Route  Sig  Dispense  Refill  . allopurinol (ZYLOPRIM) 300 MG tablet   Oral   Take 300 mg by mouth daily.          . Alum & Mag Hydroxide-Simeth (GI COCKTAIL) SUSP suspension   Oral   Take 5 mLs by mouth every 4 (four) hours as needed for indigestion. Shake well.         Marland Kitchen amLODipine (NORVASC) 5 MG tablet      TAKE ONE TABLET EVERY DAY         . amoxicillin-clavulanate (AUGMENTIN) 875-125 MG tablet               . B Complex Vitamins (VITAMIN-B COMPLEX) TABS   Oral   Take by mouth.         . cephALEXin (KEFLEX) 500 MG capsule   Oral   Take 500 mg by mouth 4 (four) times daily. Reported on 04/27/2015         . diazepam (VALIUM) 5 MG  tablet   Oral   Take 1 tablet (5 mg total) by mouth every 6 (six) hours as needed for anxiety or muscle spasms.   30 tablet   0   . docusate sodium (COLACE) 100 MG capsule   Oral   Take 100 mg by mouth 2 (two) times daily.         Marland Kitchen. HUMALOG KWIKPEN 100 UNIT/ML KiwkPen   Subcutaneous   Inject 12 Units into the skin 2 (two) times daily with a meal. Reported on 03/26/2015           Dispense as written.   Marland Kitchen. ibuprofen (ADVIL,MOTRIN) 200 MG tablet   Oral   Take 200 mg by mouth every 6 (six) hours as needed. Reported on 03/26/2015         . insulin aspart protamine- aspart (NOVOLOG MIX 70/30) (70-30) 100 UNIT/ML injection   Subcutaneous   Inject 12 Units into the skin 2 (two) times daily with a meal.          . lactulose (CHRONULAC) 10 GM/15ML solution   Oral   Take 45 mLs (30 g total) by  mouth daily as needed for severe constipation.   120 mL   0   . LANTUS SOLOSTAR 100 UNIT/ML Solostar Pen   Subcutaneous   Inject 20 Units into the skin at bedtime.            Dispense as written.   Marland Kitchen. levothyroxine (SYNTHROID, LEVOTHROID) 50 MCG tablet   Oral   Take 50 mcg by mouth daily before breakfast. Reported on 03/24/2015         . lovastatin (MEVACOR) 20 MG tablet   Oral   Take 20 mg by mouth at bedtime.          . meloxicam (MOBIC) 15 MG tablet      Reported on 03/26/2015         . ondansetron (ZOFRAN) 4 MG tablet   Oral   Take 1 tablet (4 mg total) by mouth daily as needed for nausea or vomiting.   10 tablet   0   . oxyCODONE-acetaminophen (PERCOCET) 10-325 MG tablet   Oral   Take 1 tablet by mouth every 4 (four) hours as needed for pain.   30 tablet   0   . pantoprazole (PROTONIX) 40 MG tablet   Oral   Take 40 mg by mouth 2 (two) times daily.          . psyllium (REGULOID) 0.52 g capsule   Oral   Take 0.52 g by mouth daily.         . quinapril (ACCUPRIL) 20 MG tablet   Oral   Take 20 mg by mouth daily.          Marland Kitchen. saccharomyces boulardii (FLORASTOR) 250 MG capsule   Oral   Take 250 mg by mouth 2 (two) times daily.         Marland Kitchen. senna-docusate (SENOKOT-S) 8.6-50 MG tablet   Oral   Take 2 tablets by mouth daily as needed for mild constipation.   20 tablet   0   . sucralfate (CARAFATE) 1 g tablet   Oral   Take 1 g by mouth 4 (four) times daily -  with meals and at bedtime.         . tamsulosin (FLOMAX) 0.4 MG CAPS capsule   Oral   Take 0.4 mg by mouth daily. Reported on 03/24/2015         .  traMADol (ULTRAM) 50 MG tablet   Oral   Take 1-2 tablets (50-100 mg total) by mouth every 6 (six) hours as needed for moderate pain.   50 tablet   0     Allergies Aspirin; Metformin; Pioglitazone; and Rosiglitazone  Family History  Problem Relation Age of Onset  . Hematuria Neg Hx   . Kidney cancer Neg Hx   . Prostate cancer Neg Hx    . Sickle cell anemia Neg Hx   . Tuberculosis Neg Hx     Social History Social History  Substance Use Topics  . Smoking status: Former Games developer  . Smokeless tobacco: Current User    Types: Chew  . Alcohol Use: No    Review of Systems  Constitutional: Negative for fever. Eyes: Negative for redness ENT: Negative for sore throat Cardiovascular: Negative for chest pain Respiratory: Negative for shortness of breath. Gastrointestinal: Chronic abdominal pain Genitourinary: Negative for dysuria. Musculoskeletal: Negative for back pain. Left shoulder pain Skin: Negative for rash. Left elbow abrasion Neurological: Negative for focal weakness Psychiatric: Positive for anxiety    ____________________________________________   PHYSICAL EXAM:  VITAL SIGNS: ED Triage Vitals  Enc Vitals Group     BP 05/16/15 1048 154/133 mmHg     Pulse Rate 05/16/15 1048 148     Resp 05/16/15 1048 22     Temp 05/16/15 1048 97.5 F (36.4 C)     Temp Source 05/16/15 1048 Oral     SpO2 05/16/15 1048 98 %     Weight 05/16/15 1048 219 lb (99.338 kg)     Height 05/16/15 1048 5\' 5"  (1.651 m)     Head Cir --      Peak Flow --      Pain Score 05/16/15 1049 10     Pain Loc --      Pain Edu? --      Excl. in GC? --      Constitutional: Alert and oriented. Well appearing and anxious Eyes: Conjunctivae are normal. No erythema or injection ENT   Head: Normocephalic and atraumatic.   Mouth/Throat: Mucous membranes are moist. Cardiovascular: Tachycardia, irregularly irregular rhythm. Normal and symmetric distal pulses are present in the upper extremities. No murmurs or rubs  Respiratory: Normal respiratory effort without tachypnea nor retractions. Breath sounds are clear and equal bilaterally.  Gastrointestinal: Soft and non-tender in all quadrants. No distention. There is no CVA tenderness. Genitourinary: deferred Musculoskeletal: Patient is able to range his left shoulder but it is painful if  he lifts it above 90., Left elbow range of motion is normal, mild abrasion to the elbow, other x-rays are normal Neurologic:  Normal speech and language. No gross focal neurologic deficits are appreciated. Skin:  Skin is warm, dry and intact. No rash noted. Psychiatric: Mood and affect are normal. Patient exhibits appropriate insight and judgment.  ____________________________________________    LABS (pertinent positives/negatives)  Labs Reviewed  CBC - Abnormal; Notable for the following:    RBC 3.85 (*)    Hemoglobin 11.6 (*)    HCT 35.4 (*)    All other components within normal limits  PROTIME-INR - Abnormal; Notable for the following:    Prothrombin Time 16.2 (*)    All other components within normal limits  APTT  COMPREHENSIVE METABOLIC PANEL  TROPONIN I    ____________________________________________   EKG  ED ECG REPORT I, Jene Every, the attending physician, personally viewed and interpreted this ECG.   Date: 05/16/2015  EKG Time: 10:47 AM  Rate: 152  Rhythm: atrial fibrillation,   Axis normal axis  Intervals:Atrial fibrillation  ST&T Change: Nonspecific   ____________________________________________    RADIOLOGY  Chest x-ray unremarkable CT head Left shoulder Left elbow ____________________________________________   PROCEDURES  Procedure(s) performed: none  Critical Care performed: yes CRITICAL CARE Performed by: Jene Every   Total critical care time: 30 minutes  Critical care time was exclusive of separately billable procedures and treating other patients.  Critical care was necessary to treat or prevent imminent or life-threatening deterioration.  Critical care was time spent personally by me on the following activities: development of treatment plan with patient and/or surrogate as well as nursing, discussions with consultants, evaluation of patient's response to treatment, examination of patient, obtaining history from patient  or surrogate, ordering and performing treatments and interventions, ordering and review of laboratory studies, ordering and review of radiographic studies, pulse oximetry and re-evaluation of patient's condition.   ____________________________________________   INITIAL IMPRESSION / ASSESSMENT AND PLAN / ED COURSE  Pertinent labs & imaging results that were available during my care of the patient were reviewed by me and considered in my medical decision making (see chart for details).  Patient presents with syncopal episode. EMS EKG is consistent with atrial fibrillation. They report hypotension at the scene. Normotensive in the emergency department we will give 20 mg of IV Cardizem.  Patient had significant improvement after Cardizem, in fact he converted to normal sinus rhythm. Lab work is overall unremarkable given recurrent episodes of syncope. Patient needs observation and further workup  ____________________________________________   FINAL CLINICAL IMPRESSION(S) / ED DIAGNOSES  Final diagnoses:  Syncope, unspecified syncope type  Atrial fibrillation with RVR (HCC)          Jene Every, MD 05/16/15 1440

## 2015-05-16 NOTE — ED Notes (Signed)
Patient transported to Ultrasound 

## 2015-05-16 NOTE — Progress Notes (Signed)
Pt. admitted to unit, [id number] from ED, report from Jeannett SeniorStephen, [place]. Oriented to room, call bell, Ascom phones and staff. Bed in low position. Fall safety plan reviewed, yellow non-skid socks in place, bed alarm on. Full assessment to Epic; skin assessed with Lunette Standsanya Berry, RN; rash noted to under-breast and armpit area, anti-fungal powder applied. Telemetry box verified with tele clerk and Sampson Goonhris McCollum, NT: [id number], Pt in NSR with PACs. Will continue to monitor.

## 2015-05-16 NOTE — Care Management Obs Status (Signed)
MEDICARE OBSERVATION STATUS NOTIFICATION   Patient Details  Name: Juan Walker MRN: [id number] Date of Birth: 1931-04-17   Medicare Observation Status Notification Given:  Yes (syncope)    Caren MacadamMichelle Sundeep Cary, RN 05/16/2015, 3:24 PM

## 2015-05-16 NOTE — Progress Notes (Signed)
*  PRELIMINARY RESULTS* Echocardiogram 2D Echocardiogram has been performed.  Juan Walker 05/16/2015, 6:25 PM

## 2015-05-16 NOTE — H&P (Signed)
History and Physical    Juan Walker ZOX:[id number] DOB: 08-13-1931 DOA: 05/16/2015  Referring physician: Dr. Cyril Loosen PCP: Marisue Ivan, MD  Specialists: Dr. Mechele Collin  Chief Complaint: syncope  HPI: Juan Walker is a 82 y.o. male has a past medical history significant for HTN, DM, and IBS with alternating constipation with diarrhea now with recurrent syncopal episodes and progressive weakness. Has had N/V and diarrhea for 1 week now. No fever. Denies CP or SOB. Was brought to ER this AM after syncopal episode where SVT was noted which responded to IV Diltiazem x 1. He is now admitted. Denies cardiac hx. Pt was hyponatremic in ER. Troponin normal. Head CT OK.  Review of Systems: The patient denies anorexia, fever, weight loss,, vision loss, decreased hearing, hoarseness, chest pain, dyspnea on exertion, peripheral edema, balance deficits, hemoptysis, abdominal pain, melena, hematochezia, severe indigestion/heartburn, hematuria, incontinence, genital sores, muscle weakness, suspicious skin lesions, transient blindness, difficulty walking, depression, unusual weight change, abnormal bleeding, enlarged lymph nodes, angioedema, and breast masses.   Past Medical History  Diagnosis Date  . Essential (primary) hypertension 02/16/2015  . Diverticular disease of large intestine 02/16/2015  . Acquired hypothyroidism 02/16/2015  . Diabetes mellitus (HCC) 02/16/2015    Overview:  insulin 2005 last eye exam 01/2013 [facility]   . Depression   . Glaucoma   . Gout   . Renal disorder   . BPH (benign prostatic hyperplasia)   . BPH (benign prostatic hypertrophy)   . Cataract     Right eye  . Anemia     chronic  . Diverticulosis of colon without hemorrhage   . Gout   . History of prostatitis   . Morbid obesity (HCC)   . Psoriasis   . Shingles   . Renal insufficiency    Past Surgical History  Procedure Laterality Date  . Nephrolithotomy  1972  . Sinus irrigation    .  Excision benign skin lesion ears    . Kidney stones removed    . Esophagogastroduodenoscopy (egd) with propofol N/A 04/27/2015    Procedure: ESOPHAGOGASTRODUODENOSCOPY (EGD) WITH PROPOFOL;  Surgeon: Scot Jun, MD;  Location: [facility] ENDOSCOPY;  Service: Endoscopy;  Laterality: N/A;   Social History:  reports that he has quit smoking. His smokeless tobacco use includes Chew. He reports that he does not drink alcohol or use illicit drugs.  Allergies  Allergen Reactions  . Aspirin Other (See Comments)    Constipation  . Metformin Nausea Only and Nausea And Vomiting  . Pioglitazone Other (See Comments)    Left-sided weakness  . Rosiglitazone Other (See Comments)    Left-sided weakness    Family History  Problem Relation Age of Onset  . Hematuria Neg Hx   . Kidney cancer Neg Hx   . Prostate cancer Neg Hx   . Sickle cell anemia Neg Hx   . Tuberculosis Neg Hx   . Diabetes Mellitus II Mother   . Hypertension Mother   . Stroke Father     Prior to Admission medications   Medication Sig Start Date End Date Taking? Authorizing Provider  allopurinol (ZYLOPRIM) 300 MG tablet Take 300 mg by mouth daily.  12/15/14   Historical Provider, MD  Alum & Mag Hydroxide-Simeth (GI COCKTAIL) SUSP suspension Take 5 mLs by mouth every 4 (four) hours as needed for indigestion. Shake well.    Historical Provider, MD  amLODipine (NORVASC) 5 MG tablet TAKE ONE TABLET EVERY DAY 09/25/14   Historical Provider, MD  amoxicillin-clavulanate (AUGMENTIN) 875-125 MG tablet  04/28/15   Historical Provider, MD  B Complex Vitamins (VITAMIN-B COMPLEX) TABS Take by mouth.    Historical Provider, MD  cephALEXin (KEFLEX) 500 MG capsule Take 500 mg by mouth 4 (four) times daily. Reported on 04/27/2015    Historical Provider, MD  diazepam (VALIUM) 5 MG tablet Take 1 tablet (5 mg total) by mouth every 6 (six) hours as needed for anxiety or muscle spasms. 05/05/15   Crist Fat, MD  docusate sodium (COLACE) 100 MG capsule  Take 100 mg by mouth 2 (two) times daily.    Historical Provider, MD  HUMALOG KWIKPEN 100 UNIT/ML KiwkPen Inject 12 Units into the skin 2 (two) times daily with a meal. Reported on 03/26/2015 12/08/14   Historical Provider, MD  ibuprofen (ADVIL,MOTRIN) 200 MG tablet Take 200 mg by mouth every 6 (six) hours as needed. Reported on 03/26/2015    Historical Provider, MD  insulin aspart protamine- aspart (NOVOLOG MIX 70/30) (70-30) 100 UNIT/ML injection Inject 12 Units into the skin 2 (two) times daily with a meal.     Historical Provider, MD  lactulose (CHRONULAC) 10 GM/15ML solution Take 45 mLs (30 g total) by mouth daily as needed for severe constipation. 04/13/15   Anne-Caroline Sharma Covert, MD  LANTUS SOLOSTAR 100 UNIT/ML Solostar Pen Inject 20 Units into the skin at bedtime.  01/19/15   Historical Provider, MD  levothyroxine (SYNTHROID, LEVOTHROID) 50 MCG tablet Take 50 mcg by mouth daily before breakfast. Reported on 03/24/2015 12/29/14   Historical Provider, MD  lovastatin (MEVACOR) 20 MG tablet Take 20 mg by mouth at bedtime.  11/25/14   Historical Provider, MD  meloxicam (MOBIC) 15 MG tablet Reported on 03/26/2015 01/29/15   Historical Provider, MD  ondansetron (ZOFRAN) 4 MG tablet Take 1 tablet (4 mg total) by mouth daily as needed for nausea or vomiting. 03/27/15   Darien Ramus, MD  oxyCODONE-acetaminophen (PERCOCET) 10-325 MG tablet Take 1 tablet by mouth every 4 (four) hours as needed for pain. 03/26/15   Carollee Herter A McGowan, PA-C  pantoprazole (PROTONIX) 40 MG tablet Take 40 mg by mouth 2 (two) times daily.  12/04/14   Historical Provider, MD  psyllium (REGULOID) 0.52 g capsule Take 0.52 g by mouth daily.    Historical Provider, MD  quinapril (ACCUPRIL) 20 MG tablet Take 20 mg by mouth daily.  01/12/15   Historical Provider, MD  saccharomyces boulardii (FLORASTOR) 250 MG capsule Take 250 mg by mouth 2 (two) times daily.    Historical Provider, MD  senna-docusate (SENOKOT-S) 8.6-50 MG tablet Take 2  tablets by mouth daily as needed for mild constipation. 04/13/15 04/12/16  Anne-Caroline Sharma Covert, MD  sucralfate (CARAFATE) 1 g tablet Take 1 g by mouth 4 (four) times daily -  with meals and at bedtime.    Historical Provider, MD  tamsulosin (FLOMAX) 0.4 MG CAPS capsule Take 0.4 mg by mouth daily. Reported on 03/24/2015 12/15/14   Historical Provider, MD  traMADol (ULTRAM) 50 MG tablet Take 1-2 tablets (50-100 mg total) by mouth every 6 (six) hours as needed for moderate pain. 03/24/15   Crist Fat, MD   Physical Exam: Filed Vitals:   05/16/15 1200 05/16/15 1215 05/16/15 1230 05/16/15 1245  BP: 120/54 128/56 130/67 131/64  Pulse: 87 86 86   Temp:      TempSrc:      Resp: Height:      Weight:      SpO2: 100%  97% 98%      General:  Chronically ill appearing but in no apparent distress, [place]/AT, WDWN  Eyes: PERRL, EOMI, no scleral icterus, conjunctiva clear  ENT: dry oropharynx without exudate or lesions, TM's benign, dentition fair  Neck: supple, no lymphadenopathy. No thyromegaly or bruits  Cardiovascular: regular rate without MRG; 2+ peripheral pulses, no JVD, no peripheral edema  Respiratory: CTA biL, good air movement without wheezing, rhonchi or crackled, respiratory effort normal  Abdomen: soft, non tender to palpation, positive bowel sounds, no guarding, no rebound, no organomegaly  Skin: no rashes or lesions  Musculoskeletal: normal bulk and tone, no joint swelling  Psychiatric: normal mood and affect, A&OX3  Neurologic: CN 2-12 grossly intact, Motor strength 5/5 in all 4 groups, DTR's symmetric, sensory exam non-focal  Labs on Admission:  Basic Metabolic Panel:  Recent Labs Lab 05/16/15 1112  NA 133*  K 4.2  CL 101  CO2 20*  GLUCOSE 192*  BUN 23*  CREATININE 1.77*  CALCIUM 8.3*   Liver Function Tests:  Recent Labs Lab 05/16/15 1112  AST 48*  ALT 32  ALKPHOS 101  BILITOT 0.5  PROT 7.1  ALBUMIN 2.8*   No results for input(s):  LIPASE, AMYLASE in the last 168 hours. No results for input(s): AMMONIA in the last 168 hours. CBC:  Recent Labs Lab 05/16/15 1112  WBC 9.3  HGB 11.6*  HCT 35.4*  MCV 91.9  PLT 348   Cardiac Enzymes:  Recent Labs Lab 05/16/15 1112  TROPONINI <0.03    BNP (last 3 results) No results for input(s): BNP in the last 8760 hours.  ProBNP (last 3 results) No results for input(s): PROBNP in the last 8760 hours.  CBG: No results for input(s): GLUCAP in the last 168 hours.  Radiological Exams on Admission: Ct Head Wo Contrast  05/16/2015  CLINICAL DATA:  Syncope with fall.  Hypotension. EXAM: CT HEAD WITHOUT CONTRAST TECHNIQUE: Contiguous axial images were obtained from the base of the skull through the vertex without intravenous contrast. COMPARISON:  Head CT March 30, 2010; brain MRI June 27, 2011 FINDINGS: There is mild diffuse atrophy. There is no intracranial mass hemorrhage, extra-axial fluid collection, or midline shift. There is mild patchy small vessel disease in the centra semiovale bilaterally. Elsewhere gray-white compartments appear normal. No acute infarct evident. The bony calvarium appears intact. Mastoids on the left are clear. There is opacification of several inferior mastoid air cells on the right. There is probable cerumen in the right external auditory canal. The patient has had previous antrostomies bilaterally. There is mucosal thickening throughout multiple ethmoid sinuses as well as mucosal thickening in the inferior anterior maxillary antra bilaterally. No intraorbital lesions are apparent. IMPRESSION: Atrophy with mild periventricular small vessel disease, stable. No acute infarct. No hemorrhage or mass effect. Opacification of several inferior right-sided mastoid air cells. Mastoids on the left are clear. Areas of paranasal sinus disease. Patient has had prior antrostomies bilaterally. There is probable cerumen in the right external auditory canal. Electronically  Signed   By: Bretta BangWilliam  Woodruff III M.D.   On: 05/16/2015 14:33   Dg Chest Portable 1 View  05/16/2015  CLINICAL DATA:  Shortness of breath.  Recent fall EXAM: PORTABLE CHEST 1 VIEW COMPARISON:  April 28, 2015 FINDINGS: There is no edema or consolidation. There is stable minimal scarring in the left base. Heart is upper normal in size with pulmonary vascularity within normal limits. No adenopathy. No bone lesions. IMPRESSION: No edema or consolidation.  Minimal  scarring left base, stable. Electronically Signed   By: Bretta Bang III M.D.   On: 05/16/2015 11:53    EKG: Independently reviewed.  Assessment/Plan Principal Problem:   SVT (supraventricular tachycardia) (HCC) Active Problems:   Syncope and collapse   Hyponatremia   Diarrhea   Will observe on telemetry and follow enzymes, order echo, carotids, and Cardiology consult. GI consult for persistent diarrhea. Begin IV fluids. Follow sugars. Begin po Cardizem. Repeat labs in AM. PT to evaluate for weakness.  Diet: clear liquids Fluids: NS@100  DVT Prophylaxis: SQ Heparin  Code Status: FULL  Family Communication: yes  Disposition Plan: home  Time spent: 50 min

## 2015-05-17 ENCOUNTER — Observation Stay: Payer: PPO

## 2015-05-17 LAB — CBC
HEMATOCRIT: 30.9 % — AB (ref 40.0–52.0)
Hemoglobin: 10.2 g/dL — ABNORMAL LOW (ref 13.0–18.0)
MCH: 30.7 pg (ref 26.0–34.0)
MCHC: 33 g/dL (ref 32.0–36.0)
MCV: 93.1 fL (ref 80.0–100.0)
Platelets: 314 10*3/uL (ref 150–440)
RBC: 3.32 MIL/uL — ABNORMAL LOW (ref 4.40–5.90)
RDW: 12.8 % (ref 11.5–14.5)
WBC: 7.2 10*3/uL (ref 3.8–10.6)

## 2015-05-17 LAB — COMPREHENSIVE METABOLIC PANEL
ALT: 35 U/L (ref 17–63)
AST: 49 U/L — AB (ref 15–41)
Albumin: 2.4 g/dL — ABNORMAL LOW (ref 3.5–5.0)
Alkaline Phosphatase: 101 U/L (ref 38–126)
Anion gap: 4 — ABNORMAL LOW (ref 5–15)
BILIRUBIN TOTAL: 0.6 mg/dL (ref 0.3–1.2)
BUN: 27 mg/dL — AB (ref 6–20)
CO2: 24 mmol/L (ref 22–32)
CREATININE: 1.73 mg/dL — AB (ref 0.61–1.24)
Calcium: 8.1 mg/dL — ABNORMAL LOW (ref 8.9–10.3)
Chloride: 105 mmol/L (ref 101–111)
GFR calc Af Amer: 41 mL/min — ABNORMAL LOW (ref >60)
GFR, EST NON AFRICAN AMERICAN: 35 mL/min — AB (ref >60)
Glucose, Bld: 109 mg/dL — ABNORMAL HIGH (ref 65–99)
POTASSIUM: 4.1 mmol/L (ref 3.5–5.1)
Sodium: 133 mmol/L — ABNORMAL LOW (ref 135–145)
TOTAL PROTEIN: 6.2 g/dL — AB (ref 6.5–8.1)

## 2015-05-17 LAB — GLUCOSE, CAPILLARY
Glucose-Capillary: 96 mg/dL (ref 65–99)
Glucose-Capillary: 162 mg/dL — ABNORMAL HIGH (ref 65–99)
Glucose-Capillary: 143 mg/dL — ABNORMAL HIGH (ref 65–99)
Glucose-Capillary: 97 mg/dL (ref 65–99)

## 2015-05-17 LAB — PROTIME-INR
INR: 1.35
Prothrombin Time: 16.8 seconds — ABNORMAL HIGH (ref 11.4–15.0)

## 2015-05-17 LAB — MAGNESIUM: Magnesium: 2.1 mg/dL (ref 1.7–2.4)

## 2015-05-17 LAB — TROPONIN I: Troponin I: 0.03 ng/mL (ref <0.031)

## 2015-05-17 MED ORDER — INSULIN GLARGINE 100 UNIT/ML ~~LOC~~ SOLN
10.0000 [IU] | Freq: Once | SUBCUTANEOUS | Status: AC
Start: 2015-05-17 — End: 2015-05-17
  Administered 2015-05-17: 10 [IU] via SUBCUTANEOUS
  Filled 2015-05-17: qty 0.1

## 2015-05-17 MED ORDER — POLYETHYLENE GLYCOL 3350 17 G PO PACK
17.0000 g | PACK | Freq: Two times a day (BID) | ORAL | Status: DC
  Administered 2015-05-17 – 2015-05-21 (×8): 17 g via ORAL
  Filled 2015-05-17 (×8): qty 1

## 2015-05-17 NOTE — Progress Notes (Signed)
[facility] [place] [facility]   PATIENT NAME: Juan FlurryJerry Walker    MR#:  [id number]  DATE OF BIRTH:  02/18/1932  SUBJECTIVE:  CHIEF COMPLAINT:  Pt  Is Still reporting diarrhea but denies any chest pain or shortness of breath. Denies any palpitations. Reporting weakness.  REVIEW OF SYSTEMS:  CONSTITUTIONAL: No fever, fatigue or weakness.  EYES: No blurred or double vision.  EARS, NOSE, AND THROAT: No tinnitus or ear pain.  RESPIRATORY: No cough, shortness of breath, wheezing or hemoptysis.  CARDIOVASCULAR: No chest pain, orthopnea, edema.  GASTROINTESTINAL: Reporting diarrhea, no abdominal pain.  GENITOURINARY: No dysuria, hematuria.  ENDOCRINE: No polyuria, nocturia,  HEMATOLOGY: No anemia, easy bruising or bleeding SKIN: No rash or lesion. MUSCULOSKELETAL: No joint pain or arthritis.   NEUROLOGIC: No tingling, numbness, weakness.  PSYCHIATRY: No anxiety or depression.   DRUG ALLERGIES:   Allergies  Allergen Reactions  . Aspirin Nausea And Vomiting and Other (See Comments)    Constipation  . Metformin Nausea Only and Other (See Comments)    Patient states this medication makes his blood clot.  . Pioglitazone Other (See Comments)    Left-sided weakness  . Rosiglitazone Other (See Comments)    Left-sided weakness    VITALS:  Blood pressure 154/58, pulse 68, temperature 98.2 F (36.8 C), temperature source Oral, resp. rate 18, height 5\' 5"  (1.651 m), weight 96.616 kg (213 lb), SpO2 99 %.  PHYSICAL EXAMINATION:  GENERAL:  82 y.o.-year-old patient lying in the bed with no acute distress.  EYES: Pupils equal, round, reactive to light and accommodation. No scleral icterus. Extraocular muscles intact.  HEENT: Head atraumatic, normocephalic. Oropharynx and nasopharynx clear.  NECK:  Supple, no jugular venous distention. No thyroid enlargement, no tenderness.  LUNGS: Normal breath sounds bilaterally, no wheezing, rales,rhonchi or crepitation. No use of  accessory muscles of respiration.  CARDIOVASCULAR: S1, S2 normal. No murmurs, rubs, or gallops.  ABDOMEN: Soft, nontender, nondistended. Bowel sounds present. No organomegaly or mass.  EXTREMITIES: No pedal edema, cyanosis, or clubbing.  NEUROLOGIC: Cranial nerves II through XII are intact. Muscle strength 5/5 in all extremities. Sensation intact. Gait not checked.  PSYCHIATRIC: The patient is alert and oriented x 3.  SKIN: No obvious rash, lesion, or ulcer.    LABORATORY PANEL:   CBC  Recent Labs Lab 05/17/15 0534  WBC 7.2  HGB 10.2*  HCT 30.9*  PLT 314   ------------------------------------------------------------------------------------------------------------------  Chemistries   Recent Labs Lab 05/17/15 0534  NA 133*  K 4.1  CL 105  CO2 24  GLUCOSE 109*  BUN 27*  CREATININE 1.73*  CALCIUM 8.1*  MG 2.1  AST 49*  ALT 35  ALKPHOS 101  BILITOT 0.6   ------------------------------------------------------------------------------------------------------------------  Cardiac Enzymes  Recent Labs Lab 05/17/15 0534  TROPONINI 0.03   ------------------------------------------------------------------------------------------------------------------  RADIOLOGY:  Dg Elbow Complete Left  05/16/2015  CLINICAL DATA:  Left elbow pain after fall EXAM: LEFT ELBOW - COMPLETE 3+ VIEW COMPARISON:  None. FINDINGS: No fracture, joint effusion, dislocation or appreciable arthropathy. Small exostosis at the anterior distal left humeral shaft. Small enthesophyte at the posterior olecranon. Small enthesophyte at the medial epicondyle. IMPRESSION: No fracture, joint effusion or malalignment in the left elbow. Electronically Signed   By: Delbert PhenixJason A Poff M.D.   On: 05/16/2015 15:09   Ct Head Wo Contrast  05/16/2015  CLINICAL DATA:  Syncope with fall.  Hypotension. EXAM: CT HEAD WITHOUT CONTRAST TECHNIQUE: Contiguous axial images were obtained from the base of the  skull through the vertex  without intravenous contrast. COMPARISON:  Head CT March 30, 2010; brain MRI June 27, 2011 FINDINGS: There is mild diffuse atrophy. There is no intracranial mass hemorrhage, extra-axial fluid collection, or midline shift. There is mild patchy small vessel disease in the centra semiovale bilaterally. Elsewhere gray-white compartments appear normal. No acute infarct evident. The bony calvarium appears intact. Mastoids on the left are clear. There is opacification of several inferior mastoid air cells on the right. There is probable cerumen in the right external auditory canal. The patient has had previous antrostomies bilaterally. There is mucosal thickening throughout multiple ethmoid sinuses as well as mucosal thickening in the inferior anterior maxillary antra bilaterally. No intraorbital lesions are apparent. IMPRESSION: Atrophy with mild periventricular small vessel disease, stable. No acute infarct. No hemorrhage or mass effect. Opacification of several inferior right-sided mastoid air cells. Mastoids on the left are clear. Areas of paranasal sinus disease. Patient has had prior antrostomies bilaterally. There is probable cerumen in the right external auditory canal. Electronically Signed   By: Bretta Bang III M.D.   On: 05/16/2015 14:33   US Carotid Bilateral  05/16/2015  CLINICAL DATA:  Syncope EXAM: BILATERAL CAROTID DUPLEX ULTRASOUND TECHNIQUE: Wallace Cullens scale imaging, color Doppler and duplex ultrasound were performed of bilateral carotid and vertebral arteries in the neck. COMPARISON:  None. FINDINGS: Criteria: Quantification of carotid stenosis is based on velocity parameters that correlate the residual internal carotid diameter with NASCET-based stenosis levels, using the diameter of the distal internal carotid lumen as the denominator for stenosis measurement. The following velocity measurements were obtained: RIGHT ICA:  121/32 cm/sec CCA:  77/13 cm/sec SYSTOLIC ICA/CCA RATIO:  1.6 DIASTOLIC  ICA/CCA RATIO:  1.9 ECA:  111 cm/sec LEFT ICA:  108/34 cm/sec CCA:  103/15 cm/sec SYSTOLIC ICA/CCA RATIO:  1.1 DIASTOLIC ICA/CCA RATIO:  2.4 ECA:  120 cm/sec RIGHT CAROTID ARTERY: There is a small amount of partially calcified plaque in the bulb and proximal internal carotid artery. RIGHT VERTEBRAL ARTERY:  Antegrade flow LEFT CAROTID ARTERY: There is a small amount of both calcified and noncalcified plaque in the bulb. LEFT VERTEBRAL ARTERY:  Antegrade flow IMPRESSION: There is a small amount of plaque bilaterally with velocity measurements not suggesting hemodynamically significant stenosis there Electronically Signed   By: Esperanza Heir M.D.   On: 05/16/2015 17:13   Dg Chest Portable 1 View  05/16/2015  CLINICAL DATA:  Shortness of breath.  Recent fall EXAM: PORTABLE CHEST 1 VIEW COMPARISON:  April 28, 2015 FINDINGS: There is no edema or consolidation. There is stable minimal scarring in the left base. Heart is upper normal in size with pulmonary vascularity within normal limits. No adenopathy. No bone lesions. IMPRESSION: No edema or consolidation.  Minimal scarring left base, stable. Electronically Signed   By: Bretta Bang III M.D.   On: 05/16/2015 11:53   Dg Shoulder Left  05/16/2015  CLINICAL DATA:  Fall today with left shoulder pain, initial encounter EXAM: LEFT SHOULDER - 2+ VIEW COMPARISON:  None. FINDINGS: Mild degenerative changes of the acromioclavicular joint and glenohumeral articulation are seen. No definitive acute fracture or dislocation is noted. No gross soft tissue abnormality is seen. IMPRESSION: Degenerative change without acute abnormality. Electronically Signed   By: Alcide Clever M.D.   On: 05/16/2015 15:07    EKG:   Orders placed or performed during the hospital encounter of 05/16/15  . EKG 12-Lead  . EKG 12-Lead  . ED EKG  . ED EKG  .  EKG 12-Lead  . EKG 12-Lead  . ED EKG  . ED EKG    ASSESSMENT AND PLAN:   COLEBY YETT is a 82 y.o. male has a past  medical history  IBS with alternating constipation with diarrhea now with recurrent syncopal episodes and progressive weakness. Was brought to ER this AM after syncopal episode where SVT was noted which responded to IV Diltiazem x 1.  # SVT Resolved. No other episodes noticed. Continue monitoring patient on telemetry. [facility]  #Syncope and collapse-several episodes CT head, carotid Dopplers and echocardiogram are normal. Continue close monitoring on telemetry patient might be benefited with event monitor as an outpatient PT is recommending home health Ruled out acute MI with 3 negative sets of cardiac enzymes    #hypernatremia provide IV fluids and monitor sodium-133 today  #Diarrhea CT abdomen has revealed large complex related cyst on the left kidney which is unchanged Abdominal x-ray 2 views is pending. GI consult is pending.  Continue clear liquid diet.    PT is recommending home health PT      All the records are reviewed and case discussed with Care Management/Social Workerr. Management plans discussed with the patient, family and they are in agreement.  CODE STATUS: fc   TOTAL TIME TAKING CARE OF THIS PATIENT: 35  minutes.   POSSIBLE D/C IN  1-2 DAYS, DEPENDING ON CLINICAL CONDITION.   Ramonita Lab M.D on 05/17/2015 at 1:30 PM  Between 7am to 6pm - Pager - [phone] After 6pm go to [email or web address] - password [email or web address] [facility]  [place] [facility]  Office  [phone]  CC: Primary care physician; Marisue Ivan, MD

## 2015-05-17 NOTE — Consult Note (Signed)
GI Inpatient Consult Note  Reason for Consult:   Attending Requesting Consult:  History of Present Illness: Juan Walker is a [age] y.o. male with alternating constipation/obstipation with diarrhea since Dec after he had kidney cyst drained. Has had issues with pain from the site, requiring pt to take lots pain/NSAIDS to relieve pain. Had several episodes of fecal impaction, which required laxatives/disimpaction, which are then followed by bouts of diarrhea. On last admission in 2/17, pt seen by Dr. Mechele CollinElliott for LUQ pain. EGD showed signs of reflux. Pt is normally followed by GI in [place], who has done a recent colonoscopy. Do not have any records from [place] to review. Admitted yesterday for new bout of SVT related to a syncopal episode.  Past Medical History:  Past Medical History  Diagnosis Date  . Essential (primary) hypertension 02/16/2015  . Diverticular disease of large intestine 02/16/2015  . Acquired hypothyroidism 02/16/2015  . Diabetes mellitus (HCC) 02/16/2015    Overview:  insulin 2005 last eye exam 01/2013 [facility]   . Depression   . Glaucoma   . Gout   . Renal disorder   . BPH (benign prostatic hyperplasia)   . BPH (benign prostatic hypertrophy)   . Cataract     Right eye  . Anemia     chronic  . Diverticulosis of colon without hemorrhage   . Gout   . History of prostatitis   . Morbid obesity (HCC)   . Psoriasis   . Shingles   . Renal insufficiency     Problem List: Patient Active Problem List   Diagnosis Date Noted  . Syncope and collapse 05/16/2015  . SVT (supraventricular tachycardia) (HCC) 05/16/2015  . Hyponatremia 05/16/2015  . Diarrhea 05/16/2015  . Notalgia 03/29/2015  . Renal cyst, left 03/29/2015  . Left sided abdominal pain 03/29/2015  . Acquired hypothyroidism 02/16/2015  . Benign fibroma of prostate 02/16/2015  . Cataract 02/16/2015  . Chronic anemia 02/16/2015  . Diverticular disease of large intestine 02/16/2015  .  Essential (primary) hypertension 02/16/2015  . Gout 02/16/2015  . Morbid obesity (HCC) 02/16/2015  . Psoriasis 02/16/2015  . Diabetes mellitus (HCC) 02/16/2015  . Microhematuria 02/16/2015  . Renal cyst 02/16/2015  . Type 2 diabetes mellitus (HCC) 08/06/2014    Past Surgical History: Past Surgical History  Procedure Laterality Date  . Nephrolithotomy  1972  . Sinus irrigation    . Excision benign skin lesion ears    . Kidney stones removed    . Esophagogastroduodenoscopy (egd) with propofol N/A 04/27/2015    Procedure: ESOPHAGOGASTRODUODENOSCOPY (EGD) WITH PROPOFOL;  Surgeon: Scot Junobert T Elliott, MD;  [facility];  Service: Endoscopy;  Laterality: N/A;    Allergies: Allergies  Allergen Reactions  . Aspirin Nausea And Vomiting and Other (See Comments)    Constipation  . Metformin Nausea Only and Other (See Comments)    Patient states this medication makes his blood clot.  . Pioglitazone Other (See Comments)    Left-sided weakness  . Rosiglitazone Other (See Comments)    Left-sided weakness    Home Medications: Prescriptions prior to admission  Medication Sig Dispense Refill Last Dose  . allopurinol (ZYLOPRIM) 300 MG tablet Take 300 mg by mouth daily.    05/15/2015 at Unknown time  . B Complex Vitamins (VITAMIN-B COMPLEX) TABS Take by mouth.   05/15/2015 at Unknown time  . docusate sodium (COLACE) 100 MG capsule Take 100 mg by mouth 2 (two) times daily.   05/15/2015 at Unknown time  .  HUMALOG KWIKPEN 100 UNIT/ML KiwkPen Inject 12 Units into the skin 2 (two) times daily with a meal. Reported on 03/26/2015   05/15/2015 at Unknown time  . LANTUS SOLOSTAR 100 UNIT/ML Solostar Pen Inject 20 Units into the skin at bedtime.    Taking  . levothyroxine (SYNTHROID, LEVOTHROID) 50 MCG tablet Take 50 mcg by mouth daily before breakfast. Reported on 03/24/2015   05/15/2015 at Unknown time  . lovastatin (MEVACOR) 20 MG tablet Take 20 mg by mouth at bedtime.    05/15/2015 at Unknown time  .  pantoprazole (PROTONIX) 40 MG tablet Take 40 mg by mouth 2 (two) times daily.    05/15/2015 at Unknown time  . psyllium (REGULOID) 0.52 g capsule Take 0.52 g by mouth daily.   05/15/2015 at Unknown time  . quinapril (ACCUPRIL) 20 MG tablet Take 20 mg by mouth daily.    05/15/2015 at Unknown time  . saccharomyces boulardii (FLORASTOR) 250 MG capsule Take 250 mg by mouth 2 (two) times daily.   05/15/2015 at Unknown time  . senna-docusate (SENOKOT-S) 8.6-50 MG tablet Take 2 tablets by mouth daily as needed for mild constipation. 20 tablet 0 05/15/2015 at Unknown time  . sucralfate (CARAFATE) 1 g tablet Take 1 g by mouth 4 (four) times daily -  with meals and at bedtime.   05/15/2015 at Unknown time  . tamsulosin (FLOMAX) 0.4 MG CAPS capsule Take 0.4 mg by mouth daily. Reported on 03/24/2015   05/15/2015 at Unknown time  . Alum & Mag Hydroxide-Simeth (GI COCKTAIL) SUSP suspension Take 5 mLs by mouth every 4 (four) hours as needed for indigestion. Shake well.   Taking  . amLODipine (NORVASC) 5 MG tablet TAKE ONE TABLET EVERY DAY   Taking  . amoxicillin-clavulanate (AUGMENTIN) 875-125 MG tablet Reported on 05/16/2015   Not Taking at Unknown time  . cephALEXin (KEFLEX) 500 MG capsule Take 500 mg by mouth 4 (four) times daily. Reported on 05/16/2015   Not Taking at Unknown time  . diazepam (VALIUM) 5 MG tablet Take 1 tablet (5 mg total) by mouth every 6 (six) hours as needed for anxiety or muscle spasms. (Patient not taking: Reported on 05/16/2015) 30 tablet 0 Not Taking at Unknown time  . ibuprofen (ADVIL,MOTRIN) 200 MG tablet Take 200 mg by mouth every 6 (six) hours as needed. Reported on 03/26/2015   Taking  . insulin aspart protamine- aspart (NOVOLOG MIX 70/30) (70-30) 100 UNIT/ML injection Inject 10-12 Units into the skin 2 (two) times daily with a meal.    Taking  . lactulose (CHRONULAC) 10 GM/15ML solution Take 45 mLs (30 g total) by mouth daily as needed for severe constipation. 120 mL 0 Taking  . meloxicam  (MOBIC) 15 MG tablet Reported on 05/16/2015   Not Taking at Unknown time  . ondansetron (ZOFRAN) 4 MG tablet Take 1 tablet (4 mg total) by mouth daily as needed for nausea or vomiting. 10 tablet 0 Taking  . oxyCODONE-acetaminophen (PERCOCET) 10-325 MG tablet Take 1 tablet by mouth every 4 (four) hours as needed for pain. (Patient not taking: Reported on 05/16/2015) 30 tablet 0 Not Taking at Unknown time  . traMADol (ULTRAM) 50 MG tablet Take 1-2 tablets (50-100 mg total) by mouth every 6 (six) hours as needed for moderate pain. (Patient not taking: Reported on 05/16/2015) 50 tablet 0 Not Taking at Unknown time   Home medication reconciliation was completed with the patient.   Scheduled Inpatient Medications:   . allopurinol  300 mg Oral  Daily  . diltiazem  30 mg Oral 4 times per day  . heparin  5,000 Units Subcutaneous 3 times per day  . insulin aspart  0-9 Units Subcutaneous TID WC  . insulin glargine  20 Units Subcutaneous QHS  . levothyroxine  50 mcg Oral QAC breakfast  . lisinopril  20 mg Oral Daily  . pantoprazole  40 mg Oral BID  . pravastatin  20 mg Oral q1800  . saccharomyces boulardii  250 mg Oral BID  . sodium chloride flush  3 mL Intravenous Q12H  . sucralfate  1 g Oral BID  . tamsulosin  0.4 mg Oral Daily    Continuous Inpatient Infusions:   . sodium chloride 100 mL/hr at 05/16/15 1534    PRN Inpatient Medications:  acetaminophen **OR** acetaminophen, bisacodyl, diazepam, morphine injection, ondansetron **OR** ondansetron (ZOFRAN) IV, oxyCODONE-acetaminophen **AND** oxyCODONE  Family History: family history includes Diabetes Mellitus II in his mother; Hypertension in his mother; Stroke in his father. There is no history of Hematuria, Kidney cancer, Prostate cancer, Sickle cell anemia, or Tuberculosis.  The patient's family history is negative for inflammatory bowel disorders, GI malignancy, or solid organ transplantation.  Social History:   reports that he has quit  smoking. His smokeless tobacco use includes Chew. He reports that he does not drink alcohol or use illicit drugs. The patient denies ETOH, tobacco, or drug use.   Review of Systems: Constitutional: Weight is stable.  Eyes: No changes in vision. ENT: No oral lesions, sore throat.  GI: see HPI.  Heme/Lymph: No easy bruising.  CV: No chest pain.  GU: No hematuria.  Integumentary: No rashes.  Neuro: No headaches.  Psych: No depression/anxiety.  Endocrine: No heat/cold intolerance.  Allergic/Immunologic: No urticaria.  Resp: No cough, SOB.  Musculoskeletal: No joint swelling.    Physical Examination: BP 139/46 mmHg  Pulse 75  Temp(Src) 98.5 F (36.9 C) (Oral)  Resp 16  Ht 5\' 5"  (1.651 m)  Wt 96.616 kg (213 lb)  BMI 35.44 kg/m2  SpO2 98% Gen: NAD, alert and oriented x 4 HEENT: PEERLA, EOMI, Neck: supple, no JVD or thyromegaly Chest: CTA bilaterally, no wheezes, crackles, or other adventitious sounds CV: RRR, no m/g/c/r Abd: soft, NT, ND, +BS in all four quadrants; no HSM, guarding, ridigity, or rebound tenderness Ext: no edema, well perfused with 2+ pulses, Skin: no rash or lesions noted Lymph: no LAD  Data: Lab Results  Component Value Date   WBC 7.2 05/17/2015   HGB 10.2* 05/17/2015   HCT 30.9* 05/17/2015   MCV 93.1 05/17/2015   PLT 314 05/17/2015    Recent Labs Lab 05/16/15 1112 05/17/15 0534  HGB 11.6* 10.2*   Lab Results  Component Value Date   NA 133* 05/17/2015   K 4.1 05/17/2015   CL 105 05/17/2015   CO2 24 05/17/2015   BUN 27* 05/17/2015   CREATININE 1.73* 05/17/2015   Lab Results  Component Value Date   ALT 35 05/17/2015   AST 49* 05/17/2015   ALKPHOS 101 05/17/2015   BILITOT 0.6 05/17/2015    Recent Labs Lab 05/16/15 1112 05/17/15 0534  APTT 34  --   INR 1.29 1.35   Assessment/Plan: Mr. Tarnow is a 82 y.o. male with SVT with irregular BM's since Dec, possibly medication induced. This is a chronic condition, which should be managed  as an outpatient.  Recommendations: Order abdominal x-Walker to see if pt is full of stool again. Otherwise, no plans for any endoscopies. Pt can f/u  with his GI in [place] after discharge. Thank you for the consult. Please call with questions or concerns.  Jayion Schneck, Ezzard Standing, MD

## 2015-05-17 NOTE — Progress Notes (Signed)
Dr. Bluford Kaufmannh notified. Pt has had his xray of abdomen and remains on a clear liquid diet. Pts last BM is on the 17th and pt reports it was a small bowel movement. Orders for heart healthy carb mod diet and miralax received. I will continue to assess.

## 2015-05-17 NOTE — Evaluation (Signed)
Physical Therapy Evaluation Patient Details Name: Juan RayJerry G Carriker MRN: [id number] DOB: 1931-12-15 Today's Date: 05/17/2015   History of Present Illness  Pt is an 82 y.o. male presenting to hospital with recurrent episodes of syncope, progressive weakness, and nausea/vomiting x1 week.  Pt admitted to hospital with SVT and hyponatremia.  Imaging negative for fx of L elbow and L shoulder.  PMH includes htn, DM, IBS.  Clinical Impression  Prior to admission, pt reports being independent and driving.  Pt reports h/o falling for past year but only when he "looks up" and has modified his activities due to this.  Pt lives on main floor of home with stairs to enter (pt's daughter lives on 2nd floor and works).  Currently pt is CGA with transfers and ambulation 80 feet with RW; pt with decreased gait speed and appearing emotional during session but pt steady without loss of balance.  Pt educated to not look up when standing or ambulating to prevent further falls; pt verbalizing understanding.  Pt would benefit from skilled PT to address noted impairments and functional limitations.  Recommend pt discharge to home with HHPT when medically appropriate.     Follow Up Recommendations Home health PT    Equipment Recommendations  Rolling walker with 5" wheels    Recommendations for Other Services       Precautions / Restrictions Precautions Precautions: Fall Restrictions Weight Bearing Restrictions: No      Mobility  Bed Mobility Overal bed mobility: Needs Assistance Bed Mobility: Supine to Sit     Supine to sit: Supervision;HOB elevated     General bed mobility comments: increased time and effort to perform, use of siderail  Transfers Overall transfer level: Needs assistance Equipment used: Rolling walker (2 wheeled) Transfers: Sit to/from Stand Sit to Stand: Min guard         General transfer comment: steady without loss of balance  Ambulation/Gait Ambulation/Gait assistance: Min  guard Ambulation Distance (Feet): 80 Feet Assistive device: Rolling walker (2 wheeled)   Gait velocity: decreased   General Gait Details: decreased B step length/foot clearance/heelstrike; steady without loss of balance  Stairs            Wheelchair Mobility    Modified Rankin (Stroke Patients Only)       Balance Overall balance assessment: Needs assistance Sitting-balance support: Bilateral upper extremity supported;Feet supported Sitting balance-Leahy Scale: Good     Standing balance support: Bilateral upper extremity supported (on RW) Standing balance-Leahy Scale: Good                               Pertinent Vitals/Pain Pain Assessment: No/denies pain  See flow sheet for HR and O2 vitals.    Home Living Family/patient expects to be discharged to:: Private residence Living Arrangements: Children Available Help at Discharge: Family (Pt's daughter lives on 2nd floor of home (works full time)) Type of Home:  (Town home) Home Access: Stairs to enter Entrance Stairs-Rails: Right;Left;Can reach both Secretary/administratorntrance Stairs-Number of Steps: 4 Home Layout: Two level;Able to live on main level with bedroom/bathroom (Pt lives on main floor) Home Equipment:  (Pt has walker and w/c that his wife had used.)      Prior Function Level of Independence: Independent         Comments: Pt reports he drives.  Pt reports he only falls when he looks up (happening for past year).  Pt appears to hold onto furniture with mobility  as needed.     Hand Dominance        Extremity/Trunk Assessment   Upper Extremity Assessment: LUE deficits/detail (R UE WFL)       LUE Deficits / Details: AROM L shoulder flexion and abduction to 90 degrees (painful L shoulder after that range); rest of L UE AROM at least 3/5   Lower Extremity Assessment: Generalized weakness         Communication   Communication: No difficulties  Cognition Arousal/Alertness: Awake/alert Behavior  During Therapy: WFL for tasks assessed/performed Overall Cognitive Status: Within Functional Limits for tasks assessed                      General Comments General comments (skin integrity, edema, etc.): Pt tearful/emotional during session about h/o falls and also wife (pt is a widow).  Nursing cleared pt for participation in physical therapy.  Pt agreeable to PT session.     Exercises        Assessment/Plan    PT Assessment Patient needs continued PT services  PT Diagnosis Difficulty walking;Generalized weakness   PT Problem List Decreased strength;Decreased activity tolerance;Decreased balance;Decreased mobility  PT Treatment Interventions DME instruction;Gait training;Stair training;Functional mobility training;Therapeutic activities;Therapeutic exercise;Balance training;Patient/family education   PT Goals (Current goals can be found in the Care Plan section) Acute Rehab PT Goals Patient Stated Goal: to go home PT Goal Formulation: With patient Time For Goal Achievement: 05/31/15 Potential to Achieve Goals: Fair    Frequency Min 2X/week   Barriers to discharge        Co-evaluation               End of Session Equipment Utilized During Treatment: Gait belt Activity Tolerance: Patient tolerated treatment well Patient left: in chair;with call bell/phone within reach;with chair alarm set Nurse Communication: Mobility status;Precautions    Functional Assessment Tool Used: AM-PAC without stairs Functional Limitation: Mobility: Walking and moving around Mobility: Walking and Moving Around Current Status [id number]): At least 40 percent but less than 60 percent impaired, limited or restricted Mobility: Walking and Moving Around Goal Status [phone]): At least 1 percent but less than 20 percent impaired, limited or restricted    Time: 0905-0941 PT Time Calculation (min) (ACUTE ONLY): 36 min   Charges:   PT Evaluation $PT Eval Moderate Complexity: 1  Procedure PT Treatments $Therapeutic Activity: 8-22 mins   PT G Codes:   PT G-Codes **NOT FOR INPATIENT CLASS** Functional Assessment Tool Used: AM-PAC without stairs Functional Limitation: Mobility: Walking and moving around Mobility: Walking and Moving Around Current Status (U9811): At least 40 percent but less than 60 percent impaired, limited or restricted Mobility: Walking and Moving Around Goal Status [phone]): At least 1 percent but less than 20 percent impaired, limited or restricted    Hendricks Limes 05/17/2015, 10:59 AM Hendricks Limes, PT [phone]

## 2015-05-17 NOTE — Progress Notes (Signed)
A&O. Forgetful. Very weak, up with 1 assist. IV fluids infusing. No complaints. Has not had a BM in several days, however declined a stool softener or laxative.

## 2015-05-17 NOTE — Progress Notes (Signed)
Pt CBG level 97. Per patient at home he takes 10 units when CBG level is below 100. Md notified. Acknowledged. New orders placed. Will recheck CBG during night. Will continue to monitor,

## 2015-05-18 LAB — BASIC METABOLIC PANEL
ANION GAP: 6 (ref 5–15)
BUN: 21 mg/dL — ABNORMAL HIGH (ref 6–20)
CALCIUM: 8.2 mg/dL — AB (ref 8.9–10.3)
CHLORIDE: 106 mmol/L (ref 101–111)
CO2: 22 mmol/L (ref 22–32)
CREATININE: 1.36 mg/dL — AB (ref 0.61–1.24)
GFR calc non Af Amer: 47 mL/min — ABNORMAL LOW (ref >60)
GFR, EST AFRICAN AMERICAN: 55 mL/min — AB (ref >60)
GLUCOSE: 108 mg/dL — AB (ref 65–99)
Potassium: 4.2 mmol/L (ref 3.5–5.1)
Sodium: 134 mmol/L — ABNORMAL LOW (ref 135–145)

## 2015-05-18 LAB — CBC
HEMATOCRIT: 33.8 % — AB (ref 40.0–52.0)
HEMOGLOBIN: 11.2 g/dL — AB (ref 13.0–18.0)
MCH: 30.6 pg (ref 26.0–34.0)
MCHC: 33.2 g/dL (ref 32.0–36.0)
MCV: 92.1 fL (ref 80.0–100.0)
Platelets: 323 10*3/uL (ref 150–440)
RBC: 3.67 MIL/uL — ABNORMAL LOW (ref 4.40–5.90)
RDW: 13 % (ref 11.5–14.5)
WBC: 7.6 10*3/uL (ref 3.8–10.6)

## 2015-05-18 LAB — GLUCOSE, CAPILLARY
GLUCOSE-CAPILLARY: 102 mg/dL — AB (ref 65–99)
GLUCOSE-CAPILLARY: 118 mg/dL — AB (ref 65–99)
GLUCOSE-CAPILLARY: 137 mg/dL — AB (ref 65–99)
Glucose-Capillary: 125 mg/dL — ABNORMAL HIGH (ref 65–99)
Glucose-Capillary: 148 mg/dL — ABNORMAL HIGH (ref 65–99)

## 2015-05-18 NOTE — Care Management Note (Signed)
Case Management Note  Patient Details  Name: Juan Walker MRN: [id number] Date of Birth: [birth year]  Subjective/Objective:  CM assessment. Spoke with daughter, Ms. Swiggett, who lives with patient. She Is agreeable to home health PT. No agency preference. Patient provides his own adls and uses no DME. Referral to [facility]. Daughter refused a walker since patient has one at home.                  Action/Plan: Will continue to assess for home health needs as patient progresses.   Expected Discharge Date:                  Expected Discharge Plan:  Home w Home Health Services  In-House Referral:     Discharge planning Services  CM Consult  Post Acute Care Choice:  Home Health Choice offered to:  Adult Children  DME Arranged:    DME Agency:     HH Arranged:  PT HH Agency:  [facility]  Status of Service:  In process, will continue to follow  Medicare Important Message Given:    Date Medicare IM Given:    Medicare IM give by:    Date Additional Medicare IM Given:    Additional Medicare Important Message give by:     If discussed at Long Length of Stay Meetings, dates discussed:    Additional Comments:  Marily MemosLisa M Kaliya Shreiner, RN 05/18/2015, 2:10 PM

## 2015-05-18 NOTE — Consult Note (Addendum)
  GI Inpatient Follow-up Note  Patient Identification: Juan Walker is a 82 y.o. male  Subjective: Seen by cardiology. NO BM yesterday or today so far. X-ray neg. Started miralax bid. Scheduled Inpatient Medications:  . allopurinol  300 mg Oral Daily  . diltiazem  30 mg Oral 4 times per day  . heparin  5,000 Units Subcutaneous 3 times per day  . insulin aspart  0-9 Units Subcutaneous TID WC  . insulin glargine  20 Units Subcutaneous QHS  . levothyroxine  50 mcg Oral QAC breakfast  . lisinopril  20 mg Oral Daily  . pantoprazole  40 mg Oral BID  . polyethylene glycol  17 g Oral BID  . pravastatin  20 mg Oral q1800  . saccharomyces boulardii  250 mg Oral BID  . sodium chloride flush  3 mL Intravenous Q12H  . sucralfate  1 g Oral BID  . tamsulosin  0.4 mg Oral Daily    Continuous Inpatient Infusions:   . sodium chloride 100 mL/hr at 05/18/15 0949    PRN Inpatient Medications:  acetaminophen **OR** acetaminophen, bisacodyl, diazepam, morphine injection, ondansetron **OR** ondansetron (ZOFRAN) IV, oxyCODONE-acetaminophen **AND** oxyCODONE  Review of Systems: Constitutional: Weight is stable.  Eyes: No changes in vision. ENT: No oral lesions, sore throat.  GI: see HPI.  Heme/Lymph: No easy bruising.  CV: No chest pain.  GU: No hematuria.  Integumentary: No rashes.  Neuro: No headaches.  Psych: No depression/anxiety.  Endocrine: No heat/cold intolerance.  Allergic/Immunologic: No urticaria.  Resp: No cough, SOB.  Musculoskeletal: No joint swelling.    Physical Examination: BP 149/48 mmHg  Pulse 77  Temp(Src) 98.2 F (36.8 C) (Oral)  Resp 18  Ht 5\' 5"  (1.651 m)  Wt 99.701 kg (219 lb 12.8 oz)  BMI 36.58 kg/m2  SpO2 99% Gen: NAD, alert and oriented x 4 HEENT: PEERLA, EOMI, Neck: supple, no JVD or thyromegaly Chest: CTA bilaterally, no wheezes, crackles, or other adventitious sounds CV: RRR, no m/g/c/r Abd: soft, NT, ND, +BS in all four quadrants; no HSM,  guarding, ridigity, or rebound tenderness Ext: no edema, well perfused with 2+ pulses, Skin: no rash or lesions noted Lymph: no LAD  Data: Lab Results  Component Value Date   WBC 7.6 05/18/2015   HGB 11.2* 05/18/2015   HCT 33.8* 05/18/2015   MCV 92.1 05/18/2015   PLT 323 05/18/2015    Recent Labs Lab 05/16/15 1112 05/17/15 0534 05/18/15 0542  HGB 11.6* 10.2* 11.2*   Lab Results  Component Value Date   NA 134* 05/18/2015   K 4.2 05/18/2015   CL 106 05/18/2015   CO2 22 05/18/2015   BUN 21* 05/18/2015   CREATININE 1.36* 05/18/2015   Lab Results  Component Value Date   ALT 35 05/17/2015   AST 49* 05/17/2015   ALKPHOS 101 05/17/2015   BILITOT 0.6 05/17/2015    Recent Labs Lab 05/16/15 1112 05/17/15 0534  APTT 34  --   INR 1.29 1.35   Assessment/Plan: [name] is a 82 y.o. male with hx of constipation. No diarrhea recently.   Recommendations: Continue miralax bid for now. Adjust accordingly. Can add colace prn.  I will sign off. Pt can f/u with his GI in [place] after discharge. thanks Please call with questions or concerns.  Makya Yurko, Ezzard StandingPAUL Y, MD

## 2015-05-18 NOTE — Progress Notes (Signed)
[facility] [place] [facility]   PATIENT NAME: Juan Walker    MR#:  [id number]  DATE OF BIRTH:  04/27/1931  SUBJECTIVE:  CHIEF COMPLAINT:  denies any chest pain or shortness of breath. Denies any palpitations. Reporting weakness.  have constipation since came to hospital and no BM.  REVIEW OF SYSTEMS:  CONSTITUTIONAL: No fever, fatigue or weakness.  EYES: No blurred or double vision.  EARS, NOSE, AND THROAT: No tinnitus or ear pain.  RESPIRATORY: No cough, shortness of breath, wheezing or hemoptysis.  CARDIOVASCULAR: No chest pain, orthopnea, edema.  GASTROINTESTINAL: Reporting diarrhea, no abdominal pain.  GENITOURINARY: No dysuria, hematuria.  ENDOCRINE: No polyuria, nocturia,  HEMATOLOGY: No anemia, easy bruising or bleeding SKIN: No rash or lesion. MUSCULOSKELETAL: No joint pain or arthritis.   NEUROLOGIC: No tingling, numbness, weakness.  PSYCHIATRY: No anxiety or depression.   DRUG ALLERGIES:   Allergies  Allergen Reactions  . Aspirin Nausea And Vomiting and Other (See Comments)    Constipation  . Metformin Nausea Only and Other (See Comments)    Patient states this medication makes his blood clot.  . Pioglitazone Other (See Comments)    Left-sided weakness  . Rosiglitazone Other (See Comments)    Left-sided weakness    VITALS:  Blood pressure 146/58, pulse 66, temperature 98.3 F (36.8 C), temperature source Oral, resp. rate 16, height  (1.651 m), weight 99.701 kg (219 lb 12.8 oz), SpO2 96 %.  PHYSICAL EXAMINATION:  GENERAL:  82 y.o.-year-old patient lying in the bed with no acute distress.  EYES: Pupils equal, round, reactive to light and accommodation. No scleral icterus. Extraocular muscles intact.  HEENT: Head atraumatic, normocephalic. Oropharynx and nasopharynx clear.  NECK:  Supple, no jugular venous distention. No thyroid enlargement, no tenderness.  LUNGS: Normal breath sounds bilaterally, no wheezing, rales,rhonchi  or crepitation. No use of accessory muscles of respiration.  CARDIOVASCULAR: S1, S2 normal. No murmurs, rubs, or gallops.  ABDOMEN: Soft, nontender, nondistended. Sluggish BS present. No organomegaly or mass.  EXTREMITIES: No pedal edema, cyanosis, or clubbing.  NEUROLOGIC: Cranial nerves II through XII are intact. Muscle strength 5/5 in all extremities. Sensation intact. Gait not checked.  PSYCHIATRIC: The patient is alert and oriented x 3.  SKIN: No obvious rash, lesion, or ulcer.    LABORATORY PANEL:   CBC  Recent Labs Lab 05/18/15 0542  WBC 7.6  HGB 11.2*  HCT 33.8*  PLT 323   ------------------------------------------------------------------------------------------------------------------  Chemistries   Recent Labs Lab 05/17/15 0534 05/18/15 0542  NA 133* 134*  K 4.1 4.2  CL 105 106  CO2 24 22  GLUCOSE 109* 108*  BUN 27* 21*  CREATININE 1.73* 1.36*  CALCIUM 8.1* 8.2*  MG 2.1  --   AST 49*  --   ALT 35  --   ALKPHOS 101  --   BILITOT 0.6  --    ------------------------------------------------------------------------------------------------------------------  Cardiac Enzymes  Recent Labs Lab 05/17/15 0534  TROPONINI 0.03   ------------------------------------------------------------------------------------------------------------------  RADIOLOGY:  Dg Abd 2 Views  05/17/2015  CLINICAL DATA:  Abdominal pain EXAM: ABDOMEN - 2 VIEW COMPARISON:  04/28/2015 FINDINGS: Scattered large and small bowel gas is noted. No obstructive changes are seen. No free air is noted. No abnormal mass or abnormal calcifications are seen. Degenerative changes of lumbar spine are seen. IMPRESSION: No acute abnormality noted. Electronically Signed   By: Alcide Clever M.D.   On: 05/17/2015 18:19    EKG:   Orders placed or performed  during the hospital encounter of 05/16/15  . EKG 12-Lead  . EKG 12-Lead  . ED EKG  . ED EKG  . EKG 12-Lead  . EKG 12-Lead  . ED EKG  . ED EKG     ASSESSMENT AND PLAN:   Juan Walker is a 82 y.o. male has a past medical history  IBS with alternating constipation with diarrhea now with recurrent syncopal episodes and progressive weakness. Was brought to ER this AM after syncopal episode where SVT was noted which responded to IV Diltiazem x 1.  # SVT Resolved. No other episodes noticed. Continue monitoring patient on telemetry. [facility]  #Syncope and collapse-several episodes CT head, carotid Dopplers and echocardiogram are normal. Continue close monitoring on telemetry patient might be benefited with event monitor as an outpatient PT is recommending home health Ruled out acute MI with 3 negative sets of cardiac enzymes Cardiology on case.    #hypernatremia provide IV fluids and monitor sodium  #Diarrhea alternating with constipation. CT abdomen has revealed large complex related cyst on the left kidney which is unchanged Appreciated Gi consult. Continue clear liquid diet. Stool softner to help with constipation.   PT is recommending home health PT  All the records are reviewed and case discussed with Care Management/Social Workerr. Management plans discussed with the patient, family and they are in agreement.  CODE STATUS: fc   TOTAL TIME TAKING CARE OF THIS PATIENT: 35  minutes.   POSSIBLE D/C IN  1-2 DAYS, DEPENDING ON CLINICAL CONDITION.   Altamese DillingVACHHANI, Monicka Cyran M.D on 05/18/2015 at 9:36 PM  Between 7am to 6pm - Pager - [phone] After 6pm go to [email or web address] - password [email or web address] [facility]  [facility]  Office  [phone]  CC: Primary care physician; Marisue IvanLINTHAVONG, KANHKA, MD

## 2015-05-18 NOTE — Progress Notes (Signed)
Physical Therapy Treatment Patient Details Name: Juan Walker MRN: [id number] DOB: September 07, 1931 Today's Date: 05/18/2015    History of Present Illness Pt is an 82 y.o. male presenting to hospital with recurrent episodes of syncope, progressive weakness, and nausea/vomiting x1 week.  Pt admitted to hospital with SVT and hyponatremia.  Imaging negative for fx of L elbow and L shoulder.  PMH includes htn, DM, IBS.    PT Comments    Pt was CGA for bed mobility and CGA for sit to stand with RW.  Pt required increased time and VC's for hand placement for bed mobility.  Pt was CGA for ambulation with RW for 180 feet.  Pt required occasional VC's for walker placement during ambulation.  Next session PT will attempt stair navigation.   Follow Up Recommendations  Home health PT     Equipment Recommendations  Rolling walker with 5" wheels    Recommendations for Other Services       Precautions / Restrictions Precautions Precautions: Fall Restrictions Weight Bearing Restrictions: No    Mobility  Bed Mobility Overal bed mobility: Needs Assistance Bed Mobility: Supine to Sit     Supine to sit: Min guard;HOB elevated     General bed mobility comments: increased time and effort to perform, use of siderail  Transfers Overall transfer level: Needs assistance Equipment used: Rolling walker (2 wheeled) Transfers: Sit to/from Stand Sit to Stand: Min guard         General transfer comment: steady without loss of balance  Ambulation/Gait Ambulation/Gait assistance: Min guard Ambulation Distance (Feet): 180 Feet Assistive device: Rolling walker (2 wheeled) Gait Pattern/deviations: Step-through pattern;Decreased weight shift to left Gait velocity: decreased     Required occasional VC's for walker placement.  Stairs            Wheelchair Mobility    Modified Rankin (Stroke Patients Only)       Balance Overall balance assessment: Needs assistance Sitting-balance  support: Feet supported Sitting balance-Leahy Scale: Good     Standing balance support: Bilateral upper extremity supported (RW) Standing balance-Leahy Scale: Good                      Cognition Arousal/Alertness: Awake/alert Behavior During Therapy: WFL for tasks assessed/performed Overall Cognitive Status: Within Functional Limits for tasks assessed                      Exercises Total Joint Exercises Ankle Circles/Pumps: AROM;Both;20 reps Hip ABduction/ADduction: AROM;Both;10 reps Straight Leg Raises: AROM;Both;10 reps All exercises were performed while reclined and long sitting in chair.   General Comments   Nursing was contacted and cleared pt for physical therapy.  Pt was agreeable and tolerated session well.       Pertinent Vitals/Pain Pain Assessment: No/denies pain  See flow sheet for vitals.      Home Living                      Prior Function            PT Goals (current goals can now be found in the care plan section) Acute Rehab PT Goals Patient Stated Goal: to go home PT Goal Formulation: With patient Time For Goal Achievement: 05/31/15 Potential to Achieve Goals: Fair Progress towards PT goals: Progressing toward goals    Frequency  Min 2X/week    PT Plan Current plan remains appropriate    Co-evaluation  End of Session Equipment Utilized During Treatment: Gait belt Activity Tolerance: Patient tolerated treatment well Patient left: in chair;with call bell/phone within reach;with chair alarm set     Time: [id number] PT Time Calculation (min) (ACUTE ONLY): 24 min  Charges:                       G Codes:      Lyndel SafeGarrett Oceana Walthall, SPT Lyndel SafeGarrett Raeden Schippers 05/18/2015, 4:14 PM

## 2015-05-18 NOTE — Progress Notes (Signed)
Initial Nutrition Assessment    INTERVENTION:   Meals and Snacks: Cater to patient preferences  NUTRITION DIAGNOSIS:     Reassess on follow-up  GOAL:   Patient will meet greater than or equal to 90% of their needs  MONITOR:   PO intake, Labs, Weight trends  REASON FOR ASSESSMENT:   Malnutrition Screening Tool    ASSESSMENT:    Pt admitted with SVT, syncope and collapse, diarrhea  Past Medical History  Diagnosis Date  . Essential (primary) hypertension 02/16/2015  . Diverticular disease of large intestine 02/16/2015  . Acquired hypothyroidism 02/16/2015  . Diabetes mellitus (HCC) 02/16/2015    Overview:  insulin 2005 last eye exam 01/2013 [facility]   . Depression   . Glaucoma   . Gout   . Renal disorder   . BPH (benign prostatic hyperplasia)   . BPH (benign prostatic hypertrophy)   . Cataract     Right eye  . Anemia     chronic  . Diverticulosis of colon without hemorrhage   . Gout   . History of prostatitis   . Morbid obesity (HCC)   . Psoriasis   . Shingles   . Renal insufficiency     Diet Order:  Diet heart healthy/carb modified Room service appropriate?: Yes; Fluid consistency:: Thin   Energy Intake: recorded po intake 100% of meals  Skin:  Reviewed, no issues   Recent Labs Lab 05/16/15 1112 05/17/15 0534 05/18/15 0542  NA 133* 133* 134*  K 4.2 4.1 4.2  CL 101 105 106  CO2 20* 24 22  BUN 23* 27* 21*  CREATININE 1.77* 1.73* 1.36*  CALCIUM 8.3* 8.1* 8.2*  MG  --  2.1  --   GLUCOSE 192* 109* 108*    Glucose Profile:  Recent Labs  05/18/15 0607 05/18/15 0755 05/18/15 1211  GLUCAP 102* 118* 137*   Meds: NS at 100 ml/hr, ss novolog, lantus, florastor  Height:   Ht Readings from Last 1 Encounters:  05/16/15 5\' 5"  (1.651 m)    Weight:   Wt Readings from Last 1 Encounters:  05/18/15 219 lb 12.8 oz (99.701 kg)   Filed Weights   05/16/15 1742 05/17/15 0446 05/18/15 0357  Weight: 213 lb 9.6 oz (96.888 kg) 213 lb  (96.616 kg) 219 lb 12.8 oz (99.701 kg)   Wt Readings from Last 10 Encounters:  05/18/15 219 lb 12.8 oz (99.701 kg)  05/05/15 222 lb (100.699 kg)  04/28/15 229 lb (103.874 kg)  04/13/15 229 lb (103.874 kg)  03/30/15 238 lb 14.4 oz (108.364 kg)  03/27/15 240 lb (108.863 kg)  03/26/15 241 lb 1.6 oz (109.362 kg)  03/24/15 239 lb (108.41 kg)  03/10/15 236 lb 3.2 oz (107.14 kg)  02/19/15 236 lb (107.049 kg)    BMI:  Body mass index is 36.58 kg/(m^2).   LOW Care Level  Romelle StarcherCate Kimanh Templeman MS, [place]RD, LDN [phone] Pager  [phone] Weekend/On-Call Pager

## 2015-05-18 NOTE — Consult Note (Signed)
Reason for Consult: SVT near-syncope Referring Physician: Dr. Bronson Ing Juan Walker is an 82 y.o. male.  HPI: Patient presents with history of hypertension diabetes abnormal bowel syndrome episodes of diarrhea he's had recurrent syncopal episode and progressive weakness. Patient complains of nausea vomiting diarrhea over the last week denies any significant chest pain was brought to emergency room after a near syncopal episode was found to be in SVT treated with IV Cardizem which converted him but he was admitted for further evaluation was found to have slightly reduced sodium abnormal potassium. Patient denies any significant bleeding he's had trouble with vertigo when he lifts his head looks up. Has had minimal palpitations states she's had these symptoms on and off for quite some time but had near syncope secondary to the emergency room for evaluation  Past Medical History  Diagnosis Date  . Essential (primary) hypertension 02/16/2015  . Diverticular disease of large intestine 02/16/2015  . Acquired hypothyroidism 02/16/2015  . Diabetes mellitus ([place]) 02/16/2015    Overview:  insulin 2005 last eye exam 01/2013 [facility]   . Depression   . Glaucoma   . Gout   . Renal disorder   . BPH (benign prostatic hyperplasia)   . BPH (benign prostatic hypertrophy)   . Cataract     Right eye  . Anemia     chronic  . Diverticulosis of colon without hemorrhage   . Gout   . History of prostatitis   . Morbid obesity ([place])   . Psoriasis   . Shingles   . Renal insufficiency     Past Surgical History  Procedure Laterality Date  . Nephrolithotomy  1972  . Sinus irrigation    . Excision benign skin lesion ears    . Kidney stones removed    . Esophagogastroduodenoscopy (egd) with propofol N/A 04/27/2015    Procedure: ESOPHAGOGASTRODUODENOSCOPY (EGD) WITH PROPOFOL;  Surgeon: Manya Silvas, MD;  Location: [place];  Service: Endoscopy;  Laterality: N/A;    Family History   Problem Relation Age of Onset  . Hematuria Neg Hx   . Kidney cancer Neg Hx   . Prostate cancer Neg Hx   . Sickle cell anemia Neg Hx   . Tuberculosis Neg Hx   . Diabetes Mellitus II Mother   . Hypertension Mother   . Stroke Father     Social History:  reports that he has quit smoking. His smokeless tobacco use includes Chew. He reports that he does not drink alcohol or use illicit drugs.  Allergies:  Allergies  Allergen Reactions  . Aspirin Nausea And Vomiting and Other (See Comments)    Constipation  . Metformin Nausea Only and Other (See Comments)    Patient states this medication makes his blood clot.  . Pioglitazone Other (See Comments)    Left-sided weakness  . Rosiglitazone Other (See Comments)    Left-sided weakness    Medications: I have reviewed the patient's current medications.  Results for orders placed or performed during the hospital encounter of 05/16/15 (from the past 48 hour(s))  CBC     Status: Abnormal   Collection Time: 05/16/15 11:12 AM  Result Value Ref Range   WBC 9.3 3.8 - 10.6 K/uL   RBC 3.85 (L) 4.40 - 5.90 MIL/uL   Hemoglobin 11.6 (L) 13.0 - 18.0 g/dL   HCT 35.4 (L) 40.0 - 52.0 %   MCV 91.9 80.0 - 100.0 fL   MCH 30.3 26.0 - 34.0 pg   MCHC 32.9  32.0 - 36.0 g/dL   RDW 13.1 11.5 - 14.5 %   Platelets 348 150 - 440 K/uL  Comprehensive metabolic panel     Status: Abnormal   Collection Time: 05/16/15 11:12 AM  Result Value Ref Range   Sodium 133 (L) 135 - 145 mmol/L   Potassium 4.2 3.5 - 5.1 mmol/L   Chloride 101 101 - 111 mmol/L   CO2 20 (L) 22 - 32 mmol/L   Glucose, Bld 192 (H) 65 - 99 mg/dL   BUN 23 (H) 6 - 20 mg/dL   Creatinine, Ser 1.77 (H) 0.61 - 1.24 mg/dL   Calcium 8.3 (L) 8.9 - 10.3 mg/dL   Total Protein 7.1 6.5 - 8.1 g/dL   Albumin 2.8 (L) 3.5 - 5.0 g/dL   AST 48 (H) 15 - 41 U/L   ALT 32 17 - 63 U/L   Alkaline Phosphatase 101 38 - 126 U/L   Total Bilirubin 0.5 0.3 - 1.2 mg/dL   GFR calc non Af Amer 34 (L) >60 mL/min   GFR calc  Af Amer 40 (L) >60 mL/min    Comment: (NOTE) The eGFR has been calculated using the CKD EPI equation. This calculation has not been validated in all clinical situations. eGFR's persistently <60 mL/min signify possible Chronic Kidney Disease.    Anion gap 12 5 - 15  Troponin I     Status: None   Collection Time: 05/16/15 11:12 AM  Result Value Ref Range   Troponin I <0.03 <0.031 ng/mL    Comment:        NO INDICATION OF MYOCARDIAL INJURY.   Protime-INR     Status: Abnormal   Collection Time: 05/16/15 11:12 AM  Result Value Ref Range   Prothrombin Time 16.2 (H) 11.4 - 15.0 seconds   INR 1.29   APTT     Status: None   Collection Time: 05/16/15 11:12 AM  Result Value Ref Range   aPTT 34 24 - 36 seconds  Glucose, capillary     Status: Abnormal   Collection Time: 05/16/15  5:38 PM  Result Value Ref Range   Glucose-Capillary 130 (H) 65 - 99 mg/dL  Troponin I     Status: None   Collection Time: 05/16/15  6:53 PM  Result Value Ref Range   Troponin I 0.03 <0.031 ng/mL    Comment:        NO INDICATION OF MYOCARDIAL INJURY.   Glucose, capillary     Status: Abnormal   Collection Time: 05/16/15  9:27 PM  Result Value Ref Range   Glucose-Capillary 159 (H) 65 - 99 mg/dL  Troponin I     Status: None   Collection Time: 05/16/15 11:08 PM  Result Value Ref Range   Troponin I 0.03 <0.031 ng/mL    Comment:        NO INDICATION OF MYOCARDIAL INJURY.   Magnesium     Status: None   Collection Time: 05/17/15  5:34 AM  Result Value Ref Range   Magnesium 2.1 1.7 - 2.4 mg/dL  Troponin I     Status: None   Collection Time: 05/17/15  5:34 AM  Result Value Ref Range   Troponin I 0.03 <0.031 ng/mL    Comment:        NO INDICATION OF MYOCARDIAL INJURY.   Protime-INR     Status: Abnormal   Collection Time: 05/17/15  5:34 AM  Result Value Ref Range   Prothrombin Time 16.8 (H) 11.4 - 15.0 seconds  INR 1.35   CBC     Status: Abnormal   Collection Time: 05/17/15  5:34 AM  Result Value  Ref Range   WBC 7.2 3.8 - 10.6 K/uL   RBC 3.32 (L) 4.40 - 5.90 MIL/uL   Hemoglobin 10.2 (L) 13.0 - 18.0 g/dL   HCT 30.9 (L) 40.0 - 52.0 %   MCV 93.1 80.0 - 100.0 fL   MCH 30.7 26.0 - 34.0 pg   MCHC 33.0 32.0 - 36.0 g/dL   RDW 12.8 11.5 - 14.5 %   Platelets 314 150 - 440 K/uL  Comprehensive metabolic panel     Status: Abnormal   Collection Time: 05/17/15  5:34 AM  Result Value Ref Range   Sodium 133 (L) 135 - 145 mmol/L   Potassium 4.1 3.5 - 5.1 mmol/L   Chloride 105 101 - 111 mmol/L   CO2 24 22 - 32 mmol/L   Glucose, Bld 109 (H) 65 - 99 mg/dL   BUN 27 (H) 6 - 20 mg/dL   Creatinine, Ser 1.73 (H) 0.61 - 1.24 mg/dL   Calcium 8.1 (L) 8.9 - 10.3 mg/dL   Total Protein 6.2 (L) 6.5 - 8.1 g/dL   Albumin 2.4 (L) 3.5 - 5.0 g/dL   AST 49 (H) 15 - 41 U/L   ALT 35 17 - 63 U/L   Alkaline Phosphatase 101 38 - 126 U/L   Total Bilirubin 0.6 0.3 - 1.2 mg/dL   GFR calc non Af Amer 35 (L) >60 mL/min   GFR calc Af Amer 41 (L) >60 mL/min    Comment: (NOTE) The eGFR has been calculated using the CKD EPI equation. This calculation has not been validated in all clinical situations. eGFR's persistently <60 mL/min signify possible Chronic Kidney Disease.    Anion gap 4 (L) 5 - 15  Glucose, capillary     Status: None   Collection Time: 05/17/15  7:55 AM  Result Value Ref Range   Glucose-Capillary 96 65 - 99 mg/dL  Glucose, capillary     Status: Abnormal   Collection Time: 05/17/15 11:42 AM  Result Value Ref Range   Glucose-Capillary 162 (H) 65 - 99 mg/dL  Glucose, capillary     Status: Abnormal   Collection Time: 05/17/15  5:05 PM  Result Value Ref Range   Glucose-Capillary 143 (H) 65 - 99 mg/dL   Comment 1 Notify RN   Glucose, capillary     Status: None   Collection Time: 05/17/15  9:04 PM  Result Value Ref Range   Glucose-Capillary 97 65 - 99 mg/dL  CBC     Status: Abnormal   Collection Time: 05/18/15  5:42 AM  Result Value Ref Range   WBC 7.6 3.8 - 10.6 K/uL   RBC 3.67 (L) 4.40 - 5.90  MIL/uL   Hemoglobin 11.2 (L) 13.0 - 18.0 g/dL   HCT 33.8 (L) 40.0 - 52.0 %   MCV 92.1 80.0 - 100.0 fL   MCH 30.6 26.0 - 34.0 pg   MCHC 33.2 32.0 - 36.0 g/dL   RDW 13.0 11.5 - 14.5 %   Platelets 323 150 - 440 K/uL  Basic metabolic panel     Status: Abnormal   Collection Time: 05/18/15  5:42 AM  Result Value Ref Range   Sodium 134 (L) 135 - 145 mmol/L   Potassium 4.2 3.5 - 5.1 mmol/L   Chloride 106 101 - 111 mmol/L   CO2 22 22 - 32 mmol/L   Glucose, Bld 108 (H)  65 - 99 mg/dL   BUN 21 (H) 6 - 20 mg/dL   Creatinine, Ser 1.36 (H) 0.61 - 1.24 mg/dL   Calcium 8.2 (L) 8.9 - 10.3 mg/dL   GFR calc non Af Amer 47 (L) >60 mL/min   GFR calc Af Amer 55 (L) >60 mL/min    Comment: (NOTE) The eGFR has been calculated using the CKD EPI equation. This calculation has not been validated in all clinical situations. eGFR's persistently <60 mL/min signify possible Chronic Kidney Disease.    Anion gap 6 5 - 15  Glucose, capillary     Status: Abnormal   Collection Time: 05/18/15  6:07 AM  Result Value Ref Range   Glucose-Capillary 102 (H) 65 - 99 mg/dL  Glucose, capillary     Status: Abnormal   Collection Time: 05/18/15  7:55 AM  Result Value Ref Range   Glucose-Capillary 118 (H) 65 - 99 mg/dL   Comment 1 Notify RN     Dg Elbow Complete Left  05/16/2015  CLINICAL DATA:  Left elbow pain after fall EXAM: LEFT ELBOW - COMPLETE 3+ VIEW COMPARISON:  None. FINDINGS: No fracture, joint effusion, dislocation or appreciable arthropathy. Small exostosis at the anterior distal left humeral shaft. Small enthesophyte at the posterior olecranon. Small enthesophyte at the medial epicondyle. IMPRESSION: No fracture, joint effusion or malalignment in the left elbow. Electronically Signed   By: Ilona Sorrel M.D.   On: 05/16/2015 15:09   Ct Head Wo Contrast  05/16/2015  CLINICAL DATA:  Syncope with fall.  Hypotension. EXAM: CT HEAD WITHOUT CONTRAST TECHNIQUE: Contiguous axial images were obtained from the base of the  skull through the vertex without intravenous contrast. COMPARISON:  Head CT March 30, 2010; brain MRI June 27, 2011 FINDINGS: There is mild diffuse atrophy. There is no intracranial mass hemorrhage, extra-axial fluid collection, or midline shift. There is mild patchy small vessel disease in the centra semiovale bilaterally. Elsewhere gray-white compartments appear normal. No acute infarct evident. The bony calvarium appears intact. Mastoids on the left are clear. There is opacification of several inferior mastoid air cells on the right. There is probable cerumen in the right external auditory canal. The patient has had previous antrostomies bilaterally. There is mucosal thickening throughout multiple ethmoid sinuses as well as mucosal thickening in the inferior anterior maxillary antra bilaterally. No intraorbital lesions are apparent. IMPRESSION: Atrophy with mild periventricular small vessel disease, stable. No acute infarct. No hemorrhage or mass effect. Opacification of several inferior right-sided mastoid air cells. Mastoids on the left are clear. Areas of paranasal sinus disease. Patient has had prior antrostomies bilaterally. There is probable cerumen in the right external auditory canal. Electronically Signed   By: Lowella Grip III M.D.   On: 05/16/2015 14:33   US Carotid Bilateral  05/16/2015  CLINICAL DATA:  Syncope EXAM: BILATERAL CAROTID DUPLEX ULTRASOUND TECHNIQUE: Pearline Cables scale imaging, color Doppler and duplex ultrasound were performed of bilateral carotid and vertebral arteries in the neck. COMPARISON:  None. FINDINGS: Criteria: Quantification of carotid stenosis is based on velocity parameters that correlate the residual internal carotid diameter with NASCET-based stenosis levels, using the diameter of the distal internal carotid lumen as the denominator for stenosis measurement. The following velocity measurements were obtained: RIGHT ICA:  121/32 cm/sec CCA:  39/03 cm/sec SYSTOLIC ICA/CCA  RATIO:  1.6 DIASTOLIC ICA/CCA RATIO:  1.9 ECA:  111 cm/sec LEFT ICA:  108/34 cm/sec CCA:  009/23 cm/sec SYSTOLIC ICA/CCA RATIO:  1.1 DIASTOLIC ICA/CCA RATIO:  2.4 ECA:  120 cm/sec RIGHT CAROTID ARTERY: There is a small amount of partially calcified plaque in the bulb and proximal internal carotid artery. RIGHT VERTEBRAL ARTERY:  Antegrade flow LEFT CAROTID ARTERY: There is a small amount of both calcified and noncalcified plaque in the bulb. LEFT VERTEBRAL ARTERY:  Antegrade flow IMPRESSION: There is a small amount of plaque bilaterally with velocity measurements not suggesting hemodynamically significant stenosis there Electronically Signed   By: Skipper Cliche M.D.   On: 05/16/2015 17:13   Dg Chest Portable 1 View  05/16/2015  CLINICAL DATA:  Shortness of breath.  Recent fall EXAM: PORTABLE CHEST 1 VIEW COMPARISON:  April 28, 2015 FINDINGS: There is no edema or consolidation. There is stable minimal scarring in the left base. Heart is upper normal in size with pulmonary vascularity within normal limits. No adenopathy. No bone lesions. IMPRESSION: No edema or consolidation.  Minimal scarring left base, stable. Electronically Signed   By: Lowella Grip III M.D.   On: 05/16/2015 11:53   Dg Shoulder Left  05/16/2015  CLINICAL DATA:  Fall today with left shoulder pain, initial encounter EXAM: LEFT SHOULDER - 2+ VIEW COMPARISON:  None. FINDINGS: Mild degenerative changes of the acromioclavicular joint and glenohumeral articulation are seen. No definitive acute fracture or dislocation is noted. No gross soft tissue abnormality is seen. IMPRESSION: Degenerative change without acute abnormality. Electronically Signed   By: Inez Catalina M.D.   On: 05/16/2015 15:07   Dg Abd 2 Views  05/17/2015  CLINICAL DATA:  Abdominal pain EXAM: ABDOMEN - 2 VIEW COMPARISON:  04/28/2015 FINDINGS: Scattered large and small bowel gas is noted. No obstructive changes are seen. No free air is noted. No abnormal mass or abnormal  calcifications are seen. Degenerative changes of lumbar spine are seen. IMPRESSION: No acute abnormality noted. Electronically Signed   By: Inez Catalina M.D.   On: 05/17/2015 18:19    Review of Systems  Constitutional: Positive for malaise/fatigue.  HENT: Negative.   Eyes: Negative.   Respiratory: Negative.   Cardiovascular: Positive for palpitations.  Gastrointestinal: Positive for heartburn, nausea, vomiting, abdominal pain and diarrhea.  Genitourinary: Negative.   Musculoskeletal: Negative.   Skin: Negative.   Neurological: Positive for loss of consciousness and weakness.  Psychiatric/Behavioral: Negative.    Blood pressure 148/53, pulse 68, temperature 98.7 F (37.1 C), temperature source Oral, resp. rate 16, height 5' 5"  (1.651 m), weight 99.701 kg (219 lb 12.8 oz), SpO2 96 %. Physical Exam  Nursing note and vitals reviewed. Constitutional: He is oriented to person, place, and time. He appears well-developed and well-nourished.  HENT:  Head: Normocephalic and atraumatic.  Eyes: Conjunctivae and EOM are normal. Pupils are equal, round, and reactive to light.  Neck: Normal range of motion. Neck supple.  Cardiovascular: Normal rate, regular rhythm and normal heart sounds.   Respiratory: Effort normal and breath sounds normal.  GI: Soft. Bowel sounds are normal.  Musculoskeletal: Normal range of motion.  Neurological: He is alert and oriented to person, place, and time. He has normal reflexes.  Skin: Skin is warm.  Psychiatric: He has a normal mood and affect.    Assessment/Plan: Near-syncope SVT narrow complex Obesity Hypertension Hyperlipidemia Diabetes GERD Vertigo Irritable bowel syndrome Chronic renal insufficiency stage 2 . PLAN Agree with admission for rule out for myocardial infarction Continue telemetry follow-up EKGs Continue Cardizem for SVT and rate control Recommend low-dose aspirin therapy No clear indication for heparin anticoagulation Continue  hypertension control with diltiazem/lisinopril GERD management with Protonix Hyperlipidemia therapy  with Pravachol Diabetes management with NovoLog and Lantus Short-term anticoagulation with subcutaneous insulin Recommend referral to nephrology for management and evaluation Agree with echocardiogram for assessment of valvular structures Consider ENT evaluation for vertigo We will consider medical therapy versus fall to EP for possible ablation of SVT  Bijal Siglin D. 05/18/2015, 9:03 AM

## 2015-05-19 ENCOUNTER — Other Ambulatory Visit: Payer: Self-pay | Admitting: *Deleted

## 2015-05-19 ENCOUNTER — Ambulatory Visit: Payer: PPO | Admitting: Urology

## 2015-05-19 LAB — GLUCOSE, CAPILLARY
GLUCOSE-CAPILLARY: 75 mg/dL (ref 65–99)
GLUCOSE-CAPILLARY: 128 mg/dL — AB (ref 65–99)
Glucose-Capillary: 87 mg/dL (ref 65–99)
Glucose-Capillary: 85 mg/dL (ref 65–99)
Glucose-Capillary: 109 mg/dL — ABNORMAL HIGH (ref 65–99)

## 2015-05-19 MED ORDER — BISACODYL 10 MG RE SUPP: 10.0000 mg | Freq: Every day | RECTAL | Status: DC | PRN

## 2015-05-19 NOTE — Consult Note (Addendum)
   [facility] Inpatient Consult   05/19/2015  Juan Walker [birth year] [id number]   Epic review revealed patient eligible for [facility] management services, with diagnosis of HTN, DM and high risk for falls for post hospital discharge follow up. Inpatient case manager agreed patient was appropriate for [facility] services. Patient was evaluated for community based chronic disease management services with [facility] care Management Program as a benefit of patient's [employer] Medicare. Met with the patient at the bedside to explain [facility] services. Patient endorses his primary care provider to be Dr. Dion Body. Chart reveals patient has been set up with [place] for [facility] services. Patient states the best contact number for him is [phone] and patient gave permission to talk with his daughter who patient states is his [place] at [phone]. Consent form signed. Patient will receive post hospital discharge calls and be evaluated for monthly home visits. [facility] Care Management services does not interfere with or replace any services arranged by the inpatient care management team. RNCM left contact information and [facility] literature at the bedside. Made inpatient RNCM aware that [facility] will be following for care management. For additional questions please contact:   Marquez Ceesay RN, [facility] Liaison  [phone]) Business Mobile [phone]) Toll free office

## 2015-05-20 LAB — GLUCOSE, CAPILLARY
GLUCOSE-CAPILLARY: 121 mg/dL — AB (ref 65–99)
Glucose-Capillary: 65 mg/dL (ref 65–99)
Glucose-Capillary: 67 mg/dL (ref 65–99)
Glucose-Capillary: 86 mg/dL (ref 65–99)
Glucose-Capillary: 101 mg/dL — ABNORMAL HIGH (ref 65–99)
Glucose-Capillary: 123 mg/dL — ABNORMAL HIGH (ref 65–99)

## 2015-05-20 MED ORDER — INSULIN GLARGINE 100 UNIT/ML ~~LOC~~ SOLN
10.0000 [IU] | Freq: Every day | SUBCUTANEOUS | Status: DC
  Administered 2015-05-20: 10 [IU] via SUBCUTANEOUS
  Filled 2015-05-20 (×2): qty 0.1

## 2015-05-20 MED ORDER — AMIODARONE HCL 200 MG PO TABS
400.0000 mg | ORAL_TABLET | Freq: Two times a day (BID) | ORAL | Status: DC
  Administered 2015-05-20 – 2015-05-21 (×3): 400 mg via ORAL
  Filled 2015-05-20 (×3): qty 2

## 2015-05-20 MED ORDER — SENNOSIDES-DOCUSATE SODIUM 8.6-50 MG PO TABS
1.0000 | ORAL_TABLET | Freq: Two times a day (BID) | ORAL | Status: DC
  Administered 2015-05-20 – 2015-05-21 (×2): 1 via ORAL
  Filled 2015-05-20 (×2): qty 1

## 2015-05-20 MED ORDER — MINERAL OIL RE ENEM
1.0000 | ENEMA | Freq: Once | RECTAL | Status: AC
  Administered 2015-05-21: 1 via RECTAL

## 2015-05-20 MED ORDER — MILK AND MOLASSES ENEMA: 1.0000 | Freq: Once | RECTAL | Status: DC

## 2015-05-20 MED ORDER — INSULIN ASPART 100 UNIT/ML ~~LOC~~ SOLN: 0.0000 [IU] | Freq: Three times a day (TID) | SUBCUTANEOUS | Status: DC

## 2015-05-20 MED ORDER — SUCRALFATE 1 G PO TABS
1.0000 g | ORAL_TABLET | Freq: Three times a day (TID) | ORAL | Status: DC
  Administered 2015-05-20 – 2015-05-21 (×3): 1 g via ORAL
  Filled 2015-05-20 (×3): qty 1

## 2015-05-20 NOTE — Progress Notes (Signed)
Inpatient Diabetes Program Recommendations  AACE/ADA: New Consensus Statement on Inpatient Glycemic Control (2015)  Target Ranges:  Prepandial:   less than 140 mg/dL      Peak postprandial:   less than 180 mg/dL (1-2 hours)      Critically ill patients:  140 - 180 mg/dL   Results for Juan Walker, [name] (MRN [id number]) as of 05/20/2015 13:26  Ref. Range 05/19/2015 20:09 05/20/2015 04:14 05/20/2015 05:28 05/20/2015 07:46 05/20/2015 11:50  Glucose-Capillary Latest Ref Range: 65-99 mg/dL 409109 (H) 65 67 86 811121 (H)   Diabetes history: DM2 Outpatient Diabetes medications:  Lantus Insulin 10 units daily + Novolog 10 units bid with ac breakfast and dinner. Pt. States he sometimes takes his Novolog even though eating very little.  Current orders for Inpatient glycemic control:  lantus 20 units + Sensitive Correction scale 0-9 units  Inpatient Diabetes Program Recommendations: Pt. States he has lost 35 lbs since December due to "stomach burning" and unable to eat very much. Reviewed hypoglycemia treatment with pt. Pt. Reports frequent blood sugars <70 at home. Pt. also reports taking Novolog even though he doesn't eat. Could pt. Be having hypoglycemic events at home?  Please consider decrease Lantus insulin to 10 units daily and change meal coverage to correction scale starting at 150 mg/dl when discharged home.   Thank you, Billy FischerJudy E. Isley Zinni, RN, MSN, CDE Inpatient Glycemic Control Team Team Pager [phone] (8am-5pm) 05/20/2015 3:13 PM

## 2015-05-20 NOTE — Progress Notes (Signed)
Called by unit clerk for consult at 6 PM on 05/20/15 regarding consult for renal cyst.    Upon further review of chart, it appears that the patient has being followed as an outpatient for this and was last seen recently on 05/05/2015 for this exact issue by Dr. Berniece SalinesBenjamin Herrick. Please see Epic for further details.  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  Additionally, the cyst has been drained percutaneously without relief of his abdominal pain symptoms as recently as 01/2015.  As such per Dr. Jasmine AweHerrick's note, he does not feel that the patient's ongoing left-sided back and abdominal pain is related to this cyst.  I do not see any further issues that have not already previously been addressed.  As such, will defer consult.    Please feel free to contact me tomorrow if there is additional questions or concerns that were not addressed previously.  Vanna ScotlandAshley Yanett Conkright, MD

## 2015-05-20 NOTE — Progress Notes (Signed)
Notified Dr. Anne HahnWIllis of bp of 200/76 and of abd pain and report of no BM for a week.. Meds given for pain. Enema ordered. Patient does not want enema at this time.

## 2015-05-20 NOTE — Progress Notes (Signed)
[facility] - [place] at [facility]   PATIENT NAME: Juan Walker    MR#:  [id number]  DATE OF BIRTH:  [birth year]  SUBJECTIVE:  CHIEF COMPLAINT:  denies any chest pain or shortness of breath. Denies any palpitations. Reporting weakness.  have constipation since came to hospital and no BM. Received Suppository today, still no BM.  REVIEW OF SYSTEMS:  CONSTITUTIONAL: No fever, fatigue or weakness.  EYES: No blurred or double vision.  EARS, NOSE, AND THROAT: No tinnitus or ear pain.  RESPIRATORY: No cough, shortness of breath, wheezing or hemoptysis.  CARDIOVASCULAR: No chest pain, orthopnea, edema.  GASTROINTESTINAL: Reporting diarrhea, no abdominal pain.  GENITOURINARY: No dysuria, hematuria.  ENDOCRINE: No polyuria, nocturia,  HEMATOLOGY: No anemia, easy bruising or bleeding SKIN: No rash or lesion. MUSCULOSKELETAL: No joint pain or arthritis.   NEUROLOGIC: No tingling, numbness, weakness.  PSYCHIATRY: No anxiety or depression.   DRUG ALLERGIES:   Allergies  Allergen Reactions  . Aspirin Nausea And Vomiting and Other (See Comments)    Constipation  . Metformin Nausea Only and Other (See Comments)    Patient states this medication makes his blood clot.  . Pioglitazone Other (See Comments)    Left-sided weakness  . Rosiglitazone Other (See Comments)    Left-sided weakness    VITALS:  Blood pressure 158/56, pulse 73, temperature 98 F (36.7 C), temperature source Oral, resp. rate 16, height  (1.651 m), weight 100.426 kg (221 lb 6.4 oz), SpO2 94 %.  PHYSICAL EXAMINATION:  GENERAL:  82 y.o.-year-old patient lying in the bed with no acute distress.  EYES: Pupils equal, round, reactive to light and accommodation. No scleral icterus. Extraocular muscles intact.  HEENT: Head atraumatic, normocephalic. Oropharynx and nasopharynx clear.  NECK:  Supple, no jugular venous distention. No thyroid enlargement, no tenderness.  LUNGS: Normal breath sounds  bilaterally, no wheezing, rales,rhonchi or crepitation. No use of accessory muscles of respiration.  CARDIOVASCULAR: S1, S2 normal. No murmurs, rubs, or gallops.  ABDOMEN: Soft, nontender, nondistended. Sluggish BS present. No organomegaly or mass.  EXTREMITIES: No pedal edema, cyanosis, or clubbing.  NEUROLOGIC: Cranial nerves II through XII are intact. Muscle strength 5/5 in all extremities. Sensation intact. Gait not checked.  PSYCHIATRIC: The patient is alert and oriented x 3.  SKIN: No obvious rash, lesion, or ulcer.    LABORATORY PANEL:   CBC  Recent Labs Lab 05/18/15 0542  WBC 7.6  HGB 11.2*  HCT 33.8*  PLT 323   ------------------------------------------------------------------------------------------------------------------  Chemistries   Recent Labs Lab 05/17/15 0534 05/18/15 0542  NA 133* 134*  K 4.1 4.2  CL 105 106  CO2 24 22  GLUCOSE 109* 108*  BUN 27* 21*  CREATININE 1.73* 1.36*  CALCIUM 8.1* 8.2*  MG 2.1  --   AST 49*  --   ALT 35  --   ALKPHOS 101  --   BILITOT 0.6  --    ------------------------------------------------------------------------------------------------------------------  Cardiac Enzymes  Recent Labs Lab 05/17/15 0534  TROPONINI 0.03   ------------------------------------------------------------------------------------------------------------------  RADIOLOGY:  No results found.  EKG:   Orders placed or performed during the hospital encounter of 05/16/15  . EKG 12-Lead  . EKG 12-Lead  . ED EKG  . ED EKG  . EKG 12-Lead  . EKG 12-Lead  . ED EKG  . ED EKG    ASSESSMENT AND PLAN:   Juan Walker is a 82 y.o. male has a past medical history  IBS with alternating constipation with diarrhea  now with recurrent syncopal episodes and progressive weakness. Was brought to ER this AM after syncopal episode where SVT was noted which responded to IV Diltiazem x 1.  # SVT Resolved. No other episodes noticed. Continue  monitoring patient on telemetry. Cardiology consult appreciated, as per him- "pt may need EP evaluation" Pt still have episodes of rapid  A fib on  Minimal exertion.  #Syncope and collapse-several episodes CT head, carotid Dopplers and echocardiogram are normal. Continue close monitoring on telemetry patient might be benefited with event monitor as an outpatient PT is recommending home health Ruled out acute MI with 3 negative sets of cardiac enzymes Cardiology on case. May need better control of A fib.   #hypernatremia provide IV fluids and monitor sodium  #Diarrhea alternating with constipation. CT abdomen has revealed large complex related cyst on the left kidney which is unchanged Appreciated Gi consult. Continue clear liquid diet. Stool softner to help with constipation. Added suppository.   PT is recommending home health PT  All the records are reviewed and case discussed with Care Management/Social Workerr. Management plans discussed with the patient, family and they are in agreement.  CODE STATUS: fc   TOTAL TIME TAKING CARE OF THIS PATIENT: 35  minutes.   POSSIBLE D/C IN  1-2 DAYS, DEPENDING ON CLINICAL CONDITION.   Altamese DillingVACHHANI, Jolanta Cabeza M.D on 05/20/2015 at 8:00 AM  Between 7am to 6pm - Pager - [phone] After 6pm go to [email or web address] - password [email or web address] [facility]  [facility]  Office  [phone]  CC: Primary care physician; Marisue IvanLINTHAVONG, KANHKA, MD

## 2015-05-21 DIAGNOSIS — I471 Supraventricular tachycardia: Secondary | ICD-10-CM | POA: Diagnosis not present

## 2015-05-21 LAB — BASIC METABOLIC PANEL
Anion gap: 6 (ref 5–15)
BUN: 14 mg/dL (ref 6–20)
CHLORIDE: 103 mmol/L (ref 101–111)
CO2: 24 mmol/L (ref 22–32)
CREATININE: 1.29 mg/dL — AB (ref 0.61–1.24)
Calcium: 8.5 mg/dL — ABNORMAL LOW (ref 8.9–10.3)
GFR, EST AFRICAN AMERICAN: 58 mL/min — AB (ref >60)
GFR, EST NON AFRICAN AMERICAN: 50 mL/min — AB (ref >60)
Glucose, Bld: 120 mg/dL — ABNORMAL HIGH (ref 65–99)
POTASSIUM: 3.8 mmol/L (ref 3.5–5.1)
SODIUM: 133 mmol/L — AB (ref 135–145)

## 2015-05-21 LAB — CBC
HCT: 32.6 % — ABNORMAL LOW (ref 40.0–52.0)
HEMATOCRIT: 29.7 % — AB (ref 40.0–52.0)
HEMOGLOBIN: 9.9 g/dL — AB (ref 13.0–18.0)
HEMOGLOBIN: 10.7 g/dL — AB (ref 13.0–18.0)
MCH: 30.7 pg (ref 26.0–34.0)
MCH: 30.1 pg (ref 26.0–34.0)
MCHC: 33.3 g/dL (ref 32.0–36.0)
MCHC: 32.9 g/dL (ref 32.0–36.0)
MCV: 92.3 fL (ref 80.0–100.0)
MCV: 91.5 fL (ref 80.0–100.0)
PLATELETS: 343 10*3/uL (ref 150–440)
Platelets: 306 10*3/uL (ref 150–440)
RBC: 3.21 MIL/uL — ABNORMAL LOW (ref 4.40–5.90)
RBC: 3.56 MIL/uL — AB (ref 4.40–5.90)
RDW: 13.1 % (ref 11.5–14.5)
RDW: 12.9 % (ref 11.5–14.5)
WBC: 5.6 10*3/uL (ref 3.8–10.6)
WBC: 7.3 10*3/uL (ref 3.8–10.6)

## 2015-05-21 LAB — GLUCOSE, CAPILLARY
GLUCOSE-CAPILLARY: 98 mg/dL (ref 65–99)
Glucose-Capillary: 77 mg/dL (ref 65–99)

## 2015-05-21 LAB — TSH: TSH: 6.536 u[IU]/mL — ABNORMAL HIGH (ref 0.350–4.500)

## 2015-05-21 MED ORDER — CARVEDILOL 3.125 MG PO TABS: 3.1250 mg | ORAL_TABLET | Freq: Two times a day (BID) | ORAL | Status: AC

## 2015-05-21 MED ORDER — INSULIN GLARGINE 100 UNIT/ML ~~LOC~~ SOLN: 10.0000 [IU] | Freq: Every day | SUBCUTANEOUS | Status: DC

## 2015-05-21 MED ORDER — POLYETHYLENE GLYCOL 3350 17 G PO PACK: 17.0000 g | PACK | Freq: Two times a day (BID) | ORAL | Status: AC

## 2015-05-21 MED ORDER — LISINOPRIL 20 MG PO TABS
40.0000 mg | ORAL_TABLET | Freq: Every day | ORAL | Status: DC
Start: 2015-05-22 — End: 2015-05-21

## 2015-05-21 MED ORDER — OXYCODONE-ACETAMINOPHEN 5-325 MG PO TABS: 1.0000 | ORAL_TABLET | ORAL | Status: DC | PRN

## 2015-05-21 MED ORDER — CARVEDILOL 3.125 MG PO TABS: 3.1250 mg | ORAL_TABLET | Freq: Two times a day (BID) | ORAL | Status: DC

## 2015-05-21 NOTE — Progress Notes (Signed)
Removed telemetry and removed PIV.  Given rx. Daughter present at bedside.  No questions at this time.  Escorted out of hospital via wheelchair by nursing staff.

## 2015-05-21 NOTE — Progress Notes (Addendum)
[facility] - [place] at [facility]   PATIENT NAME: Juan Walker    MR#:  [id number]  DATE OF BIRTH:  [birth year]  SUBJECTIVE:  CHIEF COMPLAINT:  denies any chest pain or shortness of breath. Denies any palpitations. Reporting weakness.  have constipation since came to hospital and no BM. Received Suppository today, still no BM.   On telemetry heart rate is going up to 140s and 150s spontaneously.  REVIEW OF SYSTEMS:  CONSTITUTIONAL: No fever, fatigue or weakness.  EYES: No blurred or double vision.  EARS, NOSE, AND THROAT: No tinnitus or ear pain.  RESPIRATORY: No cough, shortness of breath, wheezing or hemoptysis.  CARDIOVASCULAR: No chest pain, orthopnea, edema.  GASTROINTESTINAL: Chronic constipation, and abdominal pain mainly on the left side.Marland Kitchen  GENITOURINARY: No dysuria, hematuria.  ENDOCRINE: No polyuria, nocturia,  HEMATOLOGY: No anemia, easy bruising or bleeding SKIN: No rash or lesion. MUSCULOSKELETAL: No joint pain or arthritis.   NEUROLOGIC: No tingling, numbness, weakness.  PSYCHIATRY: No anxiety or depression.   DRUG ALLERGIES:   Allergies  Allergen Reactions  . Aspirin Nausea And Vomiting and Other (See Comments)    Constipation  . Metformin Nausea Only and Other (See Comments)    Patient states this medication makes his blood clot.  . Pioglitazone Other (See Comments)    Left-sided weakness  . Rosiglitazone Other (See Comments)    Left-sided weakness    VITALS:  Blood pressure 158/52, pulse 71, temperature 98 F (36.7 C), temperature source Oral, resp. rate 20, height  (1.651 m), weight 100.744 kg (222 lb 1.6 oz), SpO2 94 %.  PHYSICAL EXAMINATION:  GENERAL:  82 y.o.-year-old patient lying in the bed with no acute distress.  EYES: Pupils equal, round, reactive to light and accommodation. No scleral icterus. Extraocular muscles intact.  HEENT: Head atraumatic, normocephalic. Oropharynx and nasopharynx clear.  NECK:  Supple,  no jugular venous distention. No thyroid enlargement, no tenderness.  LUNGS: Normal breath sounds bilaterally, no wheezing, rales,rhonchi or crepitation. No use of accessory muscles of respiration.  CARDIOVASCULAR: S1, S2 normal. No murmurs, rubs, or gallops.  ABDOMEN: Soft, slight tender on left side and left side of the back, nondistended. Sluggish BS present. No organomegaly or mass.  EXTREMITIES: No pedal edema, cyanosis, or clubbing.  NEUROLOGIC: Cranial nerves II through XII are intact. Muscle strength 5/5 in all extremities. Sensation intact. Gait not checked.  PSYCHIATRIC: The patient is alert and oriented x 3.  SKIN: No obvious rash, lesion, or ulcer.    LABORATORY PANEL:   CBC  Recent Labs Lab 05/21/15 0554  WBC 5.6  HGB 9.9*  HCT 29.7*  PLT 306   ------------------------------------------------------------------------------------------------------------------  Chemistries   Recent Labs Lab 05/17/15 0534 05/18/15 0542  NA 133* 134*  K 4.1 4.2  CL 105 106  CO2 24 22  GLUCOSE 109* 108*  BUN 27* 21*  CREATININE 1.73* 1.36*  CALCIUM 8.1* 8.2*  MG 2.1  --   AST 49*  --   ALT 35  --   ALKPHOS 101  --   BILITOT 0.6  --    ------------------------------------------------------------------------------------------------------------------  Cardiac Enzymes  Recent Labs Lab 05/17/15 0534  TROPONINI 0.03   ------------------------------------------------------------------------------------------------------------------  RADIOLOGY:  No results found.   ASSESSMENT AND PLAN:   RICAHRD Walker is a 82 y.o. male has a past medical history  IBS with alternating constipation with diarrhea now with recurrent syncopal episodes and progressive weakness. Was brought to ER this AM after syncopal episode where  SVT was noted which responded to IV Diltiazem x 1.  # SVT  Continue monitoring patient on telemetry. Cardiology consult appreciated, as per him- "pt may need EP  evaluation" Pt still have episodes of rapid  A fib on  Minimal exertion.  I spoke to Dr. Juliann Paresallwood on phone, he suggested to start on oral amiodarone and he will speak to EP physician.  #Syncope and collapse-several episodes CT head, carotid Dopplers and echocardiogram are normal. Continue close monitoring on telemetry patient might be benefited with event monitor as an outpatient PT is recommending home health Ruled out acute MI with 3 negative sets of cardiac enzymes Cardiology on case. May need better control of A fib.   #hypernatremia provide IV fluids and monitor sodium  #Diarrhea alternating with constipation. With abdominal pain CT abdomen has revealed large complex related cyst on the left kidney which is unchanged Appreciated Gi consult. Continue diet. Stool softner to help with constipation. Added suppository. Also spoke to urologist for his renal cyst though he has multiple workup done in the past. He had an endoscopy done last month which showed gastritis, and he is on Protonix and sucralfate.   PT is recommending home health PT  Awaited further input from cardiologist. I spoke to his daughter on phone.  All the records are reviewed and case discussed with Care Management/Social Workerr. Management plans discussed with the patient, family and they are in agreement.  CODE STATUS: fc   TOTAL TIME TAKING CARE OF THIS PATIENT: 35  minutes.   POSSIBLE D/C IN  1-2 DAYS, DEPENDING ON CLINICAL CONDITION.   Altamese DillingVACHHANI, Juan Walker on 05/21/2015 at 9:06 AM  Between 7am to 6pm - Pager - [phone] After 6pm go to [email or web address] - password [email or web address] [facility]  [facility]  Office  [phone]  CC: Primary care physician; Marisue IvanLINTHAVONG, KANHKA, MD

## 2015-05-21 NOTE — Progress Notes (Signed)
Enema given, continue to monitor.

## 2015-05-21 NOTE — Discharge Instructions (Signed)
Follow with your urologist in office in 3-4 weeks.

## 2015-05-21 NOTE — Consult Note (Addendum)
ELECTROPHYSIOLOGY CONSULT NOTE  Patient ID: Juan Walker, MRN: [id number], DOB/AGE: 04-Mar-1931 82 y.o. Admit date: 05/16/2015 Date of Consult: 05/21/2015  Primary Physician: Marisue Ivan, MD Primary Cardiologist: [facility]  Chief Complaint: SVt   HPI Juan Walker is a 82 y.o. male  Was admitted a few days ago following presentation to the ER at which time he was noted to be an atrial tachycardia with Wenckebach conduction. He was unaware of his arrhythmia. It is been treated with amiodarone and not with anticoagulation. He has had no further arrhythmia.  Cardiac evaluation demonstrated normal LV function by echo;  The nose describes syncope. However, his description of the event to me was that he looked up to try to work on a light fixture lost his balance and started falling backwards and could not catch himself until he hit the door. He does not report syncope. He also notes that he has had repeated episodes of falls and retropulsion associated with extension of his head looking up. This dates to a fall off of a ladder. 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  He does not note lightheadedness while shaving. He does not wear a collar shortness or rotate his head to drive, i.e. things that might provoke syncope in the setting of carotid sinus hypersensitivity.  The last 4 months and been a challenge. He had some kind of procedure on his kidney. Since then he has had problems with constipation anorexia and a 40 pound weight loss. No explanation has been given.  Past Medical History  Diagnosis Date  . Essential (primary) hypertension 02/16/2015  . Diverticular disease of large intestine 02/16/2015  . Acquired hypothyroidism 02/16/2015  . Diabetes mellitus (HCC) 02/16/2015    Overview:  insulin 2005 last eye exam 01/2013 [facility]   . Depression   . Glaucoma   . Gout   . Renal disorder   . BPH (benign prostatic hyperplasia)   . BPH (benign  prostatic hypertrophy)   . Cataract     Right eye  . Anemia     chronic  . Diverticulosis of colon without hemorrhage   . Gout   . History of prostatitis   . Morbid obesity (HCC)   . Psoriasis   . Shingles   . Renal insufficiency       Surgical History:  Past Surgical History  Procedure Laterality Date  . Nephrolithotomy  1972  . Sinus irrigation    . Excision benign skin lesion ears    . Kidney stones removed    . Esophagogastroduodenoscopy (egd) with propofol N/A 04/27/2015    Procedure: ESOPHAGOGASTRODUODENOSCOPY (EGD) WITH PROPOFOL;  Surgeon: Scot Jun, MD;  Location: [facility] ENDOSCOPY;  Service: Endoscopy;  Laterality: N/A;     Home Meds: Prior to Admission medications   Medication Sig Start Date End Date Taking? Authorizing Provider  allopurinol (ZYLOPRIM) 300 MG tablet Take 300 mg by mouth daily.  12/15/14  Yes Historical Provider, MD  B Complex Vitamins (VITAMIN-B COMPLEX) TABS Take by mouth.   Yes Historical Provider, MD  docusate sodium (COLACE) 100 MG capsule Take 100 mg by mouth 2 (two) times daily.   Yes Historical Provider, MD  HUMALOG KWIKPEN 100 UNIT/ML KiwkPen Inject 12 Units into the skin 2 (two) times daily with a meal. Reported on 03/26/2015 12/08/14  Yes Historical Provider, MD  LANTUS SOLOSTAR 100 UNIT/ML Solostar Pen Inject 20 Units into the skin at bedtime.  01/19/15  Yes  Historical Provider, MD  levothyroxine (SYNTHROID, LEVOTHROID) 50 MCG tablet Take 50 mcg by mouth daily before breakfast. Reported on 03/24/2015 12/29/14  Yes Historical Provider, MD  lovastatin (MEVACOR) 20 MG tablet Take 20 mg by mouth at bedtime.  11/25/14  Yes Historical Provider, MD  pantoprazole (PROTONIX) 40 MG tablet Take 40 mg by mouth 2 (two) times daily.  12/04/14  Yes Historical Provider, MD  psyllium (REGULOID) 0.52 g capsule Take 0.52 g by mouth daily.   Yes Historical Provider, MD  quinapril (ACCUPRIL) 20 MG tablet Take 20 mg by mouth daily.  01/12/15  Yes Historical  Provider, MD  saccharomyces boulardii (FLORASTOR) 250 MG capsule Take 250 mg by mouth 2 (two) times daily.   Yes Historical Provider, MD  senna-docusate (SENOKOT-S) 8.6-50 MG tablet Take 2 tablets by mouth daily as needed for mild constipation. 04/13/15 04/12/16 Yes Anne-Caroline Sharma Covert, MD  sucralfate (CARAFATE) 1 g tablet Take 1 g by mouth 4 (four) times daily -  with meals and at bedtime.   Yes Historical Provider, MD  tamsulosin (FLOMAX) 0.4 MG CAPS capsule Take 0.4 mg by mouth daily. Reported on 03/24/2015 12/15/14  Yes Historical Provider, MD  Alum & Mag Hydroxide-Simeth (GI COCKTAIL) SUSP suspension Take 5 mLs by mouth every 4 (four) hours as needed for indigestion. Shake well.    Historical Provider, MD  amLODipine (NORVASC) 5 MG tablet TAKE ONE TABLET EVERY DAY 09/25/14   Historical Provider, MD  amoxicillin-clavulanate (AUGMENTIN) 875-125 MG tablet Reported on 05/16/2015 04/28/15   Historical Provider, MD  cephALEXin (KEFLEX) 500 MG capsule Take 500 mg by mouth 4 (four) times daily. Reported on 05/16/2015    Historical Provider, MD  diazepam (VALIUM) 5 MG tablet Take 1 tablet (5 mg total) by mouth every 6 (six) hours as needed for anxiety or muscle spasms. Patient not taking: Reported on 05/16/2015 05/05/15   Crist Fat, MD  ibuprofen (ADVIL,MOTRIN) 200 MG tablet Take 200 mg by mouth every 6 (six) hours as needed. Reported on 03/26/2015    Historical Provider, MD  insulin aspart protamine- aspart (NOVOLOG MIX 70/30) (70-30) 100 UNIT/ML injection Inject 10-12 Units into the skin 2 (two) times daily with a meal.     Historical Provider, MD  lactulose (CHRONULAC) 10 GM/15ML solution Take 45 mLs (30 g total) by mouth daily as needed for severe constipation. 04/13/15   Anne-Caroline Sharma Covert, MD  meloxicam (MOBIC) 15 MG tablet Reported on 05/16/2015 01/29/15   Historical Provider, MD  ondansetron (ZOFRAN) 4 MG tablet Take 1 tablet (4 mg total) by mouth daily as needed for nausea or vomiting. 03/27/15    Darien Ramus, MD  oxyCODONE-acetaminophen (PERCOCET) 10-325 MG tablet Take 1 tablet by mouth every 4 (four) hours as needed for pain. Patient not taking: Reported on 05/16/2015 03/26/15   Carollee Herter A McGowan, PA-C  traMADol (ULTRAM) 50 MG tablet Take 1-2 tablets (50-100 mg total) by mouth every 6 (six) hours as needed for moderate pain. Patient not taking: Reported on 05/16/2015 03/24/15   Crist Fat, MD    Inpatient Medications:  . allopurinol  300 mg Oral Daily  . amiodarone  400 mg Oral BID  . heparin  5,000 Units Subcutaneous 3 times per day  . insulin aspart  0-9 Units Subcutaneous TID WC  . insulin glargine  10 Units Subcutaneous QHS  . levothyroxine  50 mcg Oral QAC breakfast  . [START ON 05/22/2015] lisinopril  40 mg Oral Daily  . pantoprazole  40 mg Oral  BID  . polyethylene glycol  17 g Oral BID  . pravastatin  20 mg Oral q1800  . saccharomyces boulardii  250 mg Oral BID  . senna-docusate  1 tablet Oral BID  . sodium chloride flush  3 mL Intravenous Q12H  . sucralfate  1 g Oral TID WC & HS  . tamsulosin  0.4 mg Oral Daily      Allergies:  Allergies  Allergen Reactions  . Aspirin Nausea And Vomiting and Other (See Comments)    Constipation  . Metformin Nausea Only and Other (See Comments)    Patient states this medication makes his blood clot.  . Pioglitazone Other (See Comments)    Left-sided weakness  . Rosiglitazone Other (See Comments)    Left-sided weakness    Social History   Social History  . Marital Status: Widowed    Spouse Name: N/A  . Number of Children: N/A  . Years of Education: N/A   Occupational History  . Not on file.   Social History Main Topics  . Smoking status: Former Games developermoker  . Smokeless tobacco: Current User    Types: Chew  . Alcohol Use: No  . Drug Use: No  . Sexual Activity: Not on file   Other Topics Concern  . Not on file   Social History Narrative     Family History  Problem Relation Age of Onset  . Hematuria Neg  Hx   . Kidney cancer Neg Hx   . Prostate cancer Neg Hx   . Sickle cell anemia Neg Hx   . Tuberculosis Neg Hx   . Diabetes Mellitus II Mother   . Hypertension Mother   . Stroke Father      ROS:  Please see the history of present illness.     All other systems reviewed and negative.    Physical Exam: Blood pressure 161/87, pulse 91, temperature 98 F (36.7 C), temperature source Oral, resp. rate 18, height 5\' 5"  (1.651 m), weight 222 lb 1.6 oz (100.744 kg), SpO2 100 %. General: Well developed, well nourished male in no acute distress. Head: Normocephalic, atraumatic, sclera non-icteric, no xanthomas, nares are without discharge. EENT: normal Lymph Nodes:  none Back: without scoliosis/kyphosis, no CVA tendersness Neck: Negative for carotid bruits. JVD not elevated. Lungs: Clear bilaterally to auscultation without wheezes, rales, or rhonchi. Breathing is unlabored. Heart: RRR with S1 S2.  2/6 systolic  murmur , rubs, or gallops appreciated. Abdomen: Soft, non-tender, non-distended with normoactive bowel sounds. No hepatomegaly. No rebound/guarding. No obvious abdominal masses. Msk:  Strength and tone appear normal for age. Extremities: No clubbing or cyanosis. +tr edema.  Distal pedal pulses are 2+ and equal bilaterally. Skin: Warm and Dry Neuro: Alert and oriented X 3. CN III-XII intact Grossly normal sensory and motor function . Psych:  Responds to questions appropriately with a normal affect.      Labs: Cardiac Enzymes No results for input(s): CKTOTAL, CKMB, TROPONINI in the last 72 hours. CBC Lab Results  Component Value Date   WBC 7.3 05/21/2015   HGB 10.7* 05/21/2015   HCT 32.6* 05/21/2015   MCV 91.5 05/21/2015   PLT 343 05/21/2015   PROTIME: No results for input(s): LABPROT, INR in the last 72 hours. Chemistry  Recent Labs Lab 05/17/15 0534  05/21/15 0929  NA 133*  < > 133*  K 4.1  < > 3.8  CL 105  < > 103  CO2 24  < > 24  BUN 27*  < >  14  CREATININE 1.73*   < > 1.29*  CALCIUM 8.1*  < > 8.5*  PROT 6.2*  --   --   BILITOT 0.6  --   --   ALKPHOS 101  --   --   ALT 35  --   --   AST 49*  --   --   GLUCOSE 109*  < > 120*  < > = values in this interval not displayed. Lipids No results found for: CHOL, HDL, LDLCALC, TRIG BNP No results found for: PROBNP Thyroid Function Tests: No results for input(s): TSH, T4TOTAL, T3FREE, THYROIDAB in the last 72 hours.  Invalid input(s): FREET3    Miscellaneous No results found for: DDIMER  Radiology/Studies:  Dg Elbow Complete Left  05/16/2015  CLINICAL DATA:  Left elbow pain after fall EXAM: LEFT ELBOW - COMPLETE 3+ VIEW COMPARISON:  None. FINDINGS: No fracture, joint effusion, dislocation or appreciable arthropathy. Small exostosis at the anterior distal left humeral shaft. Small enthesophyte at the posterior olecranon. Small enthesophyte at the medial epicondyle. IMPRESSION: No fracture, joint effusion or malalignment in the left elbow. Electronically Signed   By: Delbert Phenix M.D.   On: 05/16/2015 15:09   Ct Head Wo Contrast  05/16/2015  CLINICAL DATA:  Syncope with fall.  Hypotension. EXAM: CT HEAD WITHOUT CONTRAST TECHNIQUE: Contiguous axial images were obtained from the base of the skull through the vertex without intravenous contrast. COMPARISON:  Head CT March 30, 2010; brain MRI June 27, 2011 FINDINGS: There is mild diffuse atrophy. There is no intracranial mass hemorrhage, extra-axial fluid collection, or midline shift. There is mild patchy small vessel disease in the centra semiovale bilaterally. Elsewhere gray-white compartments appear normal. No acute infarct evident. The bony calvarium appears intact. Mastoids on the left are clear. There is opacification of several inferior mastoid air cells on the right. There is probable cerumen in the right external auditory canal. The patient has had previous antrostomies bilaterally. There is mucosal thickening throughout multiple ethmoid sinuses as well  as mucosal thickening in the inferior anterior maxillary antra bilaterally. No intraorbital lesions are apparent. IMPRESSION: Atrophy with mild periventricular small vessel disease, stable. No acute infarct. No hemorrhage or mass effect. Opacification of several inferior right-sided mastoid air cells. Mastoids on the left are clear. Areas of paranasal sinus disease. Patient has had prior antrostomies bilaterally. There is probable cerumen in the right external auditory canal. Electronically Signed   By: Bretta Bang III M.D.   On: 05/16/2015 14:33   Mr Abdomen W Wo Contrast  04/23/2015  CLINICAL DATA:  Indeterminate cystic renal mass in the left kidney on recent unenhanced CT. EXAM: MRI ABDOMEN WITHOUT AND WITH CONTRAST TECHNIQUE: Multiplanar multisequence MR imaging of the abdomen was performed both before and after the administration of intravenous contrast. CONTRAST:  10mL MULTIHANCE GADOBENATE DIMEGLUMINE 529 MG/ML IV SOLN COMPARISON:  03/27/2015 unenhanced CT abdomen/ pelvis. FINDINGS: Lower chest: Clear lung bases. Hepatobiliary: Normal liver size and configuration. No hepatic steatosis. There are five tiny scattered subcentimeter liver cysts. No suspicious liver masses. Normal gallbladder with no cholelithiasis. No biliary ductal dilatation. Common bile duct diameter 5 mm. No choledocholithiasis. Pancreas: There is a solitary 0.5 cm unilocular nonenhancing cystic lesion in the pancreatic body (series 2/ image 23), with no main pancreatic duct dilation. No pancreas divisum. Spleen: Normal size. No mass. Adrenals/Urinary Tract: Normal adrenals. No hydronephrosis. There are a few subcentimeter simple renal cysts scattered in both kidneys. In the lower left kidney, there is an  exophytic 9.0 x 8.9 x 6.9 cm renal mass with uniform precontrast T1 hyperintensity, fluid fluid level and variable T2 signal, which demonstrates a solitary thin eccentric internal septation and no internal enhancing solid  components, in keeping with a Bosniak category 26F hemorrhagic/ proteinaceous renal cyst. Stomach/Bowel: Grossly normal stomach. Visualized small and large bowel is normal caliber, with no bowel wall thickening. Vascular/Lymphatic: Normal caliber abdominal aorta. Patent portal, splenic, hepatic and renal veins. No pathologically enlarged lymph nodes in the abdomen. Other: No abdominal ascites or focal fluid collection. Musculoskeletal: No aggressive appearing focal osseous lesions. IMPRESSION: 1. Large 9.0 cm exophytic Bosniak category 26F hemorrhagic/proteinaceous renal cyst in the lower left kidney, which demonstrates a thin enhancing internal septation and no solid enhancement. A follow-up renal mass protocol MRI abdomen with and without intravenous contrast is recommended at 6 months and 12 months, if clinically warranted. 2. Subcentimeter nonenhancing unilocular pancreatic body cystic lesion without main pancreatic duct dilation, likely a side branch IPMN, which can also be reassessed on the follow-up MRI abdomen. Electronically Signed   By: Delbert Phenix M.D.   On: 04/23/2015 14:09   Ct Abdomen Pelvis W Contrast  04/28/2015  CLINICAL DATA:  No bowel movement for 2 weeks, is develops severe LEFT side abdominal pain overnight, unable to keep down liquids or food, history kidney disease, renal cyst, essential primary hypertension, diabetes mellitus, gout, BPH, former smoker EXAM: CT ABDOMEN AND PELVIS WITH CONTRAST TECHNIQUE: Multidetector CT imaging of the abdomen and pelvis was performed using the standard protocol following bolus administration of intravenous contrast. Sagittal and coronal MPR images reconstructed from axial data set. CONTRAST:  OMNIPAQUE IOHEXOL 300 MG/ML SOLN IV. Dilute oral contrast. COMPARISON:  03/27/2015 FINDINGS: Lung bases clear. Again identified large cystic lesion at inferior pole LEFT kidney 9.1 x 8.1 cm image 52 not significantly changed. Slightly higher attenuation component  anteriorly measuring 3.9 x 1.8 cm appears little changed. Additional tiny BILATERAL renal cysts without hydronephrosis or calculi. Liver, gallbladder, spleen, pancreas, and adrenal glands unremarkable. Increased stool in colon. Normal appendix. Minimal sigmoid diverticulosis. Stomach and bowel loops otherwise unremarkable. Few prostatic calcifications with otherwise unremarkable prostate gland, bladder and ureters. Scattered atherosclerotic calcifications. No mass, adenopathy, free air, free fluid or inflammatory process. Bones demineralized. IMPRESSION: Large complicated cystic lesion at inferior pole LEFT kidney grossly unchanged. Minimal sigmoid diverticulosis. No acute intra-abdominal or intrapelvic abnormalities. Electronically Signed   By: Ulyses Southward M.D.   On: 04/28/2015 12:18   US Carotid Bilateral  05/16/2015  CLINICAL DATA:  Syncope EXAM: BILATERAL CAROTID DUPLEX ULTRASOUND TECHNIQUE: Wallace Cullens scale imaging, color Doppler and duplex ultrasound were performed of bilateral carotid and vertebral arteries in the neck. COMPARISON:  None. FINDINGS: Criteria: Quantification of carotid stenosis is based on velocity parameters that correlate the residual internal carotid diameter with NASCET-based stenosis levels, using the diameter of the distal internal carotid lumen as the denominator for stenosis measurement. The following velocity measurements were obtained: RIGHT ICA:  121/32 cm/sec CCA:  77/13 cm/sec SYSTOLIC ICA/CCA RATIO:  1.6 DIASTOLIC ICA/CCA RATIO:  1.9 ECA:  111 cm/sec LEFT ICA:  108/34 cm/sec CCA:  103/15 cm/sec SYSTOLIC ICA/CCA RATIO:  1.1 DIASTOLIC ICA/CCA RATIO:  2.4 ECA:  120 cm/sec RIGHT CAROTID ARTERY: There is a small amount of partially calcified plaque in the bulb and proximal internal carotid artery. RIGHT VERTEBRAL ARTERY:  Antegrade flow LEFT CAROTID ARTERY: There is a small amount of both calcified and noncalcified plaque in the bulb. LEFT VERTEBRAL ARTERY:  Antegrade  flow IMPRESSION:  There is a small amount of plaque bilaterally with velocity measurements not suggesting hemodynamically significant stenosis there Electronically Signed   By: Esperanza Heir M.D.   On: 05/16/2015 17:13   Dg Chest Portable 1 View  05/16/2015  CLINICAL DATA:  Shortness of breath.  Recent fall EXAM: PORTABLE CHEST 1 VIEW COMPARISON:  April 28, 2015 FINDINGS: There is no edema or consolidation. There is stable minimal scarring in the left base. Heart is upper normal in size with pulmonary vascularity within normal limits. No adenopathy. No bone lesions. IMPRESSION: No edema or consolidation.  Minimal scarring left base, stable. Electronically Signed   By: Bretta Bang III M.D.   On: 05/16/2015 11:53   Dg Shoulder Left  05/16/2015  CLINICAL DATA:  Fall today with left shoulder pain, initial encounter EXAM: LEFT SHOULDER - 2+ VIEW COMPARISON:  None. FINDINGS: Mild degenerative changes of the acromioclavicular joint and glenohumeral articulation are seen. No definitive acute fracture or dislocation is noted. No gross soft tissue abnormality is seen. IMPRESSION: Degenerative change without acute abnormality. Electronically Signed   By: Alcide Clever M.D.   On: 05/16/2015 15:07   Dg Abd 2 Views  05/17/2015  CLINICAL DATA:  Abdominal pain EXAM: ABDOMEN - 2 VIEW COMPARISON:  04/28/2015 FINDINGS: Scattered large and small bowel gas is noted. No obstructive changes are seen. No free air is noted. No abnormal mass or abnormal calcifications are seen. Degenerative changes of lumbar spine are seen. IMPRESSION: No acute abnormality noted. Electronically Signed   By: Alcide Clever M.D.   On: 05/17/2015 18:19   Dg Abd Acute W/chest  04/28/2015  CLINICAL DATA:  No BM in 2 weeks. EXAM: DG ABDOMEN ACUTE W/ 1V CHEST COMPARISON:  04/28/2015 FINDINGS: There is no evidence of dilated bowel loops or free intraperitoneal air. Enteric contrast material is identified within the right colon. IV contrast material is identified  within the collecting systems of both kidneys. No radiopaque calculi or other significant radiographic abnormality is seen. Heart size and mediastinal contours are within normal limits. Both lungs are clear. IMPRESSION: Negative abdominal radiographs.  No acute cardiopulmonary disease. Electronically Signed   By: Signa Kell M.D.   On: 04/28/2015 15:16    Telemetry  repeated episodes of nonsustained atrial tachycardia were noted. EKG: Atrial tachycardia with 5-4 and 6-5 Wenckebach conduction  Subsequent ECG was normal   Assessment and Plan:   Atrial tachycardia with Wenckebach conduction  Recurrent falls associated with extension of his neck and looking up  Weight loss/constitutional illness   The patient's rhythm issue is an atrial tachycardia. I agree with Dr. Vennie Homans that there is no indication for anticoagulation.  I would stop the amiodarone given his toxicity and use calcium channel blockers or beta blockers.  I discussed with neurology the issue of headache extension as an explanation for symptoms. Vertebral basilar insufficiency was mentioned as a possibility as was Parkinson's disease. I don't think the issues are arrhythmic.  I also worry as to why he's lost 40 pounds over the last 4 months following his renal procedure. I am also impressed at his "passive death wish" by which I mean he articulating that "I am 82 may be just too much to do anything else"  I will take the liberty of starting him on a low-dose beta blocker avoiding calcium blockers because of the constipation. Please let me know if I can be of further assistance  I have also raised with him the possibility of seeing  neurology. After some discussions he was agreeable. I will defer this to internal medicine    Sherryl Manges

## 2015-05-21 NOTE — Progress Notes (Signed)
Slept well during the night once pain subsided. Will continue to monitor.

## 2015-05-21 NOTE — Progress Notes (Signed)
Subjective:  Pt feeling much better denies further lightheadness or syncope.He denies any CP.  Objective:  Vital Signs in the last 24 hours: Temp:  [98 F (36.7 C)] 98 F (36.7 C) (03/23 1159) Pulse Rate:  [69-91] 91 (03/23 1159) Resp:  [18-22] 18 (03/23 1159) BP: (158-200)/(48-87) 161/87 mmHg (03/23 1159) SpO2:  [94 %-100 %] 100 % (03/23 1159) Weight:  [100.744 kg (222 lb 1.6 oz)] 100.744 kg (222 lb 1.6 oz) (03/23 0442)  Intake/Output from previous day: 03/22 0701 - 03/23 0700 In: 360 [P.O.:360] Out: 220 [Urine:220] Intake/Output from this shift: Total I/O In: 120 [P.O.:120] Out: 500 [Urine:500]  Physical Exam: General appearance: alert, cooperative and appears stated age Neck: no adenopathy, no carotid bruit, no JVD, supple, symmetrical, trachea midline and thyroid not enlarged, symmetric, no tenderness/mass/nodules Lungs: clear to auscultation bilaterally Heart: regular rate and rhythm, S1, S2 normal, no murmur, click, rub or gallop Abdomen: soft, non-tender; bowel sounds normal; no masses,  no organomegaly Extremities: extremities normal, atraumatic, no cyanosis or edema Pulses: 2+ and symmetric Skin: Skin color, texture, turgor normal. No rashes or lesions Neurologic: Alert and oriented X 3, normal strength and tone. Normal symmetric reflexes. Normal coordination and gait weight loss weakness  Lab Results:  Recent Labs  05/21/15 0554 05/21/15 0929  WBC 5.6 7.3  HGB 9.9* 10.7*  PLT 306 343    Recent Labs  05/21/15 0929  NA 133*  K 3.8  CL 103  CO2 24  GLUCOSE 120*  BUN 14  CREATININE 1.29*   No results for input(s): TROPONINI in the last 72 hours.  Invalid input(s): CK, MB Hepatic Function Panel No results for input(s): PROT, ALBUMIN, AST, ALT, ALKPHOS, BILITOT, BILIDIR, IBILI in the last 72 hours. No results for input(s): CHOL in the last 72 hours. No results for input(s): PROTIME in the last 72 hours.  Imaging: Imaging results have been  reviewed  Cardiac Studies:  Assessment/Plan:  Arrhythmia Palpitations Syncope  Hyponatremia Weight loss Weakness HTN DM Anemia. Marland Kitchen. PLAN Continue tele Rec rate contro with B-bloker or Ca# Blocker Continue low dose amiodarone Hold off on anticoug long term Fall precautions  Agree with DM control F/U with cardiology as out pt      Kari Kerth D. 05/21/2015, 4:05 PM

## 2015-05-21 NOTE — Progress Notes (Signed)
Physical Therapy Treatment Patient Details Name: Juan Walker MRN: [id number] DOB: 15-Dec-1931 Today's Date: 05/21/2015    History of Present Illness Pt is an 82 y.o. male presenting to hospital with recurrent episodes of syncope, progressive weakness, and nausea/vomiting x1 week.  Pt admitted to hospital with SVT and hyponatremia.  Imaging negative for fx of L elbow and L shoulder.  PMH includes htn, DM, IBS.    PT Comments    Pt demonstrates improving strength and stability with transfers and ambulation. Balance is well corrected during ambulation with use of rolling walker. Vitals continuously monitored and HR remains below 100 bpm and SaO2>90% on room air. Pt denies lightheadedness or presyncopal symptoms during ambulation. Gait speed is functional for full household ambulation. Pt received an enema approximately 1 hour prior to PT treatment and is awaiting BM. Encouraged continued ambulation with staff to promote bowels to move. Pt will benefit from skilled PT services to address deficits in strength, balance, and mobility in order to return to full function at home.    Follow Up Recommendations  Home health PT     Equipment Recommendations  Rolling walker with 5" wheels    Recommendations for Other Services       Precautions / Restrictions Precautions Precautions: Fall Restrictions Weight Bearing Restrictions: No    Mobility  Bed Mobility Overal bed mobility: Modified Independent Bed Mobility: Supine to Sit     Supine to sit: Modified independent (Device/Increase time)     General bed mobility comments: Good speed and sequencing. HOB only minimally elevated and minimal use of bed rail  Transfers Overall transfer level: Needs assistance Equipment used: Rolling walker (2 wheeled) Transfers: Sit to/from Stand Sit to Stand: Supervision         General transfer comment: Pt demonstrates good speed and sequencing. Able to perform sit to stand without UE support and  without assistive device  Ambulation/Gait Ambulation/Gait assistance: Supervision Ambulation Distance (Feet): 220 Feet Assistive device: Rolling walker (2 wheeled) Gait Pattern/deviations: Step-through pattern Gait velocity: Decreased but functional for full household mobility Gait velocity interpretation: <1.8 ft/sec, indicative of risk for recurrent falls General Gait Details: Pt with mild decrease in step length during ambulation. Overall speed is adequate for full household ambulation. Vitals monitored througout and HR remains <100 bpm. SaO2 >90% on room air. Pt denies DOE but reports low energy. Balance is good with use of rolling walker. Pt able to perform limited ambualtion in room without UE support   Stairs            Wheelchair Mobility    Modified Rankin (Stroke Patients Only)       Balance Overall balance assessment: Needs assistance Sitting-balance support: No upper extremity supported Sitting balance-Leahy Scale: Good     Standing balance support: No upper extremity supported Standing balance-Leahy Scale: Fair Standing balance comment: Full balance screening deferred                    Cognition Arousal/Alertness: Awake/alert Behavior During Therapy: WFL for tasks assessed/performed Overall Cognitive Status: Within Functional Limits for tasks assessed                      Exercises      General Comments        Pertinent Vitals/Pain Pain Assessment: 0-10 Pain Score: 9  Pain Location: "Abdominal pain." Pain Intervention(s): Monitored during session    Home Living  Prior Function            PT Goals (current goals can now be found in the care plan section) Acute Rehab PT Goals Patient Stated Goal: to go home PT Goal Formulation: With patient Time For Goal Achievement: 05/31/15 Potential to Achieve Goals: Good Progress towards PT goals: Progressing toward goals    Frequency  Min 2X/week     PT Plan Current plan remains appropriate    Co-evaluation             End of Session Equipment Utilized During Treatment: Gait belt Activity Tolerance: Patient tolerated treatment well Patient left: in chair;with call bell/phone within reach;with chair alarm set [facility] beside recliner)     Time: 1308-6578 PT Time Calculation (min) (ACUTE ONLY): 15 min  Charges:  $Therapeutic Exercise: 8-22 mins                    G Codes:      Sharalyn Ink Maddix Kliewer PT, DPT   Charlita Brian 05/21/2015, 12:13 PM

## 2015-05-21 NOTE — Care Management (Signed)
Made aware of phone call received from daughter. Updated primary RN of daughters concerns.

## 2015-05-21 NOTE — Care Management (Signed)
[facility] notified of discharge.

## 2015-05-21 NOTE — Consult Note (Signed)
   [facility]   05/21/2015  Juan RayJerry G Tamburri Dec 28, 1931 [id number]  This hospital liaison was sent an e-mail by patient's daughter Raechel AcheCindy Swiggett, which stated she wanted to talk to someone about her dad's ongoing abdominal pain. Called daughter back and made her aware [facility] services are post discharge services and any concerns while admitted needed to be addressed to the staff in the acute care setting. Daughter voiced understanding and stated she had talked to the nurse and doctor caring for her dad yesterday evening. Ms.Swiggett stated she was thankful for the call and looked forward to her dad engaging [facility]. Made inpatient case manager aware of daughter's concerns via epic in basket and plan to speak with her in person if available today.  Costella HatcherJanci Sarely Stracener RN, BSN [facility]  Hospital Liaison  [phone]) Business Mobile [phone] Toll free office

## 2015-05-22 ENCOUNTER — Other Ambulatory Visit: Payer: Self-pay | Admitting: *Deleted

## 2015-05-22 NOTE — Patient Outreach (Signed)
[facility]) Care Management  05/22/2015  Juan Walker 02-19-1932 [id number]   Transition of care RNCM placed call to [name] as part of the transition of care program, no answer at number listed, I then placed a call to [name] his HCPOA and per EPIC patient has given verbal permission that we speak with her. HIPPA verified.  Rosemarie BeathCindy Walker states that this is not a good time to talk , that they are just walking in the door  from [facility] with [name], she requested that it would be best if I called back on Monday.  Unable to complete transition of care call at this time.  Plan Will place transition of care call to patient/caregiver on Monday, March 27 as requested.  Egbert GaribaldiKimberly Navid Lenzen, RN, [facility] Care Management [phone] Mobile [phone]- Toll Free Main Office

## 2015-05-25 ENCOUNTER — Other Ambulatory Visit: Payer: Self-pay | Admitting: *Deleted

## 2015-05-25 NOTE — Discharge Summary (Signed)
[facility] - [place] at [facility]   PATIENT NAME: Juan Walker    MR#:  [id number]  DATE OF BIRTH:  October 10, 1931  DATE OF ADMISSION:  05/16/2015 ADMITTING PHYSICIAN: Marguarite Arbour, MD  DATE OF DISCHARGE: 05/21/2015  4:49 PM  PRIMARY CARE PHYSICIAN: Marisue Ivan, MD    ADMISSION DIAGNOSIS:  Syncope [R55] Atrial fibrillation with RVR (HCC) [I48.91] Syncope, unspecified syncope type [R55]  DISCHARGE DIAGNOSIS:  Principal Problem:   SVT (supraventricular tachycardia) (HCC) Active Problems:   Syncope and collapse   Hyponatremia   Diarrhea   SECONDARY DIAGNOSIS:   Past Medical History  Diagnosis Date  . Essential (primary) hypertension 02/16/2015  . Diverticular disease of large intestine 02/16/2015  . Acquired hypothyroidism 02/16/2015  . Diabetes mellitus (HCC) 02/16/2015    Overview:  insulin 2005 last eye exam 01/2013 [facility]   . Depression   . Glaucoma   . Gout   . Renal disorder   . BPH (benign prostatic hyperplasia)   . BPH (benign prostatic hypertrophy)   . Cataract     Right eye  . Anemia     chronic  . Diverticulosis of colon without hemorrhage   . Gout   . History of prostatitis   . Morbid obesity (HCC)   . Psoriasis   . Shingles   . Renal insufficiency     HOSPITAL COURSE:   # SVT Continue monitoring patient on telemetry. Cardiology consult appreciated, as per him- "pt may need EP evaluation" Pt still have episodes of rapid A fib on Minimal exertion. I spoke to Dr. Juliann Pares on phone, he suggested to start on oral amiodarone and stable after that.  #Syncope and collapse-several episodes CT head, carotid Dopplers and echocardiogram are normal. Continue close monitoring on telemetry patient might be benefited with event monitor as an outpatient PT is recommending home health Ruled out acute MI with 3 negative sets of cardiac enzymes Cardiology on case. With amiodarone- a fib under  control.  #hypernatremia provide IV fluids and monitor sodium  #Diarrhea alternating with constipation. With abdominal pain CT abdomen has revealed large complex related cyst on the left kidney which is unchanged Appreciated Gi consult. Continue diet. Stool softner to help with constipation. Added suppository. Also spoke to urologist for his renal cyst though he has multiple workup done in the past. He had an endoscopy done last month which showed gastritis, and he is on Protonix and sucralfate.   DISCHARGE CONDITIONS:   Stable.  CONSULTS OBTAINED:  Treatment Team:  Vanna Scotland, MD  DRUG ALLERGIES:   Allergies  Allergen Reactions  . Aspirin Nausea And Vomiting and Other (See Comments)    Constipation  . Metformin Nausea Only and Other (See Comments)    Patient states this medication makes his blood clot.  . Pioglitazone Other (See Comments)    Left-sided weakness  . Rosiglitazone Other (See Comments)    Left-sided weakness    DISCHARGE MEDICATIONS:   Discharge Medication List as of 05/21/2015  4:13 PM    START taking these medications   Details  carvedilol (COREG) 3.125 MG tablet Take 1 tablet (3.125 mg total) by mouth 2 (two) times daily with a meal., Starting 05/21/2015, Until Discontinued, Print    insulin glargine (LANTUS) 100 UNIT/ML injection Inject 0.1 mLs (10 Units total) into the skin at bedtime., Starting 05/21/2015, Until Discontinued, Normal    oxyCODONE-acetaminophen (PERCOCET/ROXICET) 5-325 MG tablet Take 1 tablet by mouth every 4 (four) hours as needed for  moderate pain., Starting 05/21/2015, Until Discontinued, Print    polyethylene glycol (MIRALAX / GLYCOLAX) packet Take 17 g by mouth 2 (two) times daily., Starting 05/21/2015, Until Discontinued, Normal      CONTINUE these medications which have NOT CHANGED   Details  allopurinol (ZYLOPRIM) 300 MG tablet Take 300 mg by mouth daily. , Starting 12/15/2014, Until Discontinued, Historical Med    B  Complex Vitamins (VITAMIN-B COMPLEX) TABS Take by mouth., Until Discontinued, Historical Med    docusate sodium (COLACE) 100 MG capsule Take 100 mg by mouth 2 (two) times daily., Until Discontinued, Historical Med    levothyroxine (SYNTHROID, LEVOTHROID) 50 MCG tablet Take 50 mcg by mouth daily before breakfast. Reported on 03/24/2015, Starting 12/29/2014, Until Discontinued, Historical Med    lovastatin (MEVACOR) 20 MG tablet Take 20 mg by mouth at bedtime. , Starting 11/25/2014, Until Discontinued, Historical Med    pantoprazole (PROTONIX) 40 MG tablet Take 40 mg by mouth 2 (two) times daily. , Starting 12/04/2014, Until Discontinued, Historical Med    psyllium (REGULOID) 0.52 g capsule Take 0.52 g by mouth daily., Until Discontinued, Historical Med    quinapril (ACCUPRIL) 20 MG tablet Take 20 mg by mouth daily. , Starting 01/12/2015, Until Discontinued, Historical Med    saccharomyces boulardii (FLORASTOR) 250 MG capsule Take 250 mg by mouth 2 (two) times daily., Until Discontinued, Historical Med    senna-docusate (SENOKOT-S) 8.6-50 MG tablet Take 2 tablets by mouth daily as needed for mild constipation., Starting 04/13/2015, Until Tue 04/12/16, Print    sucralfate (CARAFATE) 1 g tablet Take 1 g by mouth 4 (four) times daily -  with meals and at bedtime., Until Discontinued, Historical Med    tamsulosin (FLOMAX) 0.4 MG CAPS capsule Take 0.4 mg by mouth daily. Reported on 03/24/2015, Starting 12/15/2014, Until Discontinued, Historical Med    Alum & Mag Hydroxide-Simeth (GI COCKTAIL) SUSP suspension Take 5 mLs by mouth every 4 (four) hours as needed for indigestion. Shake well., Until Discontinued, Historical Med    diazepam (VALIUM) 5 MG tablet Take 1 tablet (5 mg total) by mouth every 6 (six) hours as needed for anxiety or muscle spasms., Starting 05/05/2015, Until Discontinued, Print    insulin aspart protamine- aspart (NOVOLOG MIX 70/30) (70-30) 100 UNIT/ML injection Inject 10-12 Units into  the skin 2 (two) times daily with a meal. , Until Discontinued, Historical Med    ondansetron (ZOFRAN) 4 MG tablet Take 1 tablet (4 mg total) by mouth daily as needed for nausea or vomiting., Starting 03/27/2015, Until Discontinued, Print    traMADol (ULTRAM) 50 MG tablet Take 1-2 tablets (50-100 mg total) by mouth every 6 (six) hours as needed for moderate pain., Starting 03/24/2015, Until Discontinued, Print      STOP taking these medications     HUMALOG KWIKPEN 100 UNIT/ML KiwkPen      LANTUS SOLOSTAR 100 UNIT/ML Solostar Pen      amLODipine (NORVASC) 5 MG tablet      amoxicillin-clavulanate (AUGMENTIN) 875-125 MG tablet      cephALEXin (KEFLEX) 500 MG capsule      ibuprofen (ADVIL,MOTRIN) 200 MG tablet      lactulose (CHRONULAC) 10 GM/15ML solution      meloxicam (MOBIC) 15 MG tablet      oxyCODONE-acetaminophen (PERCOCET) 10-325 MG tablet          DISCHARGE INSTRUCTIONS:   Follow with cardiology and GI in office.  If you experience worsening of your admission symptoms, develop shortness of breath, life threatening  emergency, suicidal or homicidal thoughts you must seek medical attention immediately by calling 911 or calling your MD immediately  if symptoms less severe.  You Must read complete instructions/literature along with all the possible adverse reactions/side effects for all the Medicines you take and that have been prescribed to you. Take any new Medicines after you have completely understood and accept all the possible adverse reactions/side effects.   Please note  You were cared for by a hospitalist during your hospital stay. If you have any questions about your discharge medications or the care you received while you were in the hospital after you are discharged, you can call the unit and asked to speak with the hospitalist on call if the hospitalist that took care of you is not available. Once you are discharged, your primary care physician will handle any  further medical issues. Please note that NO REFILLS for any discharge medications will be authorized once you are discharged, as it is imperative that you return to your primary care physician (or establish a relationship with a primary care physician if you do not have one) for your aftercare needs so that they can reassess your need for medications and monitor your lab values.    Today   CHIEF COMPLAINT:   Chief Complaint  Patient presents with  . Tachycardia    HISTORY OF PRESENT ILLNESS:  Juan Walker  is a 82 y.o. male with a known history of HTN, DM, and IBS with alternating constipation with diarrhea now with recurrent syncopal episodes and progressive weakness. Has had N/V and diarrhea for 1 week now. No fever. Denies CP or SOB. Was brought to ER this AM after syncopal episode where SVT was noted which responded to IV Diltiazem x 1. He is now admitted. Denies cardiac hx. Pt was hyponatremic in ER. Troponin normal. Head CT OK.  VITAL SIGNS:  Blood pressure 161/87, pulse 91, temperature 98 F (36.7 C), temperature source Oral, resp. rate 18, height 5\' 5"  (1.651 m), weight 100.744 kg (222 lb 1.6 oz), SpO2 100 %.  I/O:  No intake or output data in the 24 hours ending 05/25/15 0942  PHYSICAL EXAMINATION:   GENERAL: 82 y.o.-year-old patient lying in the bed with no acute distress.  EYES: Pupils equal, round, reactive to light and accommodation. No scleral icterus. Extraocular muscles intact.  HEENT: Head atraumatic, normocephalic. Oropharynx and nasopharynx clear.  NECK: Supple, no jugular venous distention. No thyroid enlargement, no tenderness.  LUNGS: Normal breath sounds bilaterally, no wheezing, rales,rhonchi or crepitation. No use of accessory muscles of respiration.  CARDIOVASCULAR: S1, S2 normal. No murmurs, rubs, or gallops.  ABDOMEN: Soft, slight tender on left side and left side of the back, nondistended. Sluggish BS present. No organomegaly or mass.   EXTREMITIES: No pedal edema, cyanosis, or clubbing.  NEUROLOGIC: Cranial nerves II through XII are intact. Muscle strength 5/5 in all extremities. Sensation intact. Gait not checked.  PSYCHIATRIC: The patient is alert and oriented x 3.  SKIN: No obvious rash, lesion, or ulcer.    DATA REVIEW:   CBC  Recent Labs Lab 05/21/15 0929  WBC 7.3  HGB 10.7*  HCT 32.6*  PLT 343    Chemistries   Recent Labs Lab 05/21/15 0929  NA 133*  K 3.8  CL 103  CO2 24  GLUCOSE 120*  BUN 14  CREATININE 1.29*  CALCIUM 8.5*    Cardiac Enzymes No results for input(s): TROPONINI in the last 168 hours.  Microbiology Results  Results  for orders placed or performed in visit on 03/30/15  Microscopic Examination     Status: Abnormal   Collection Time: 03/30/15  9:38 AM  Result Value Ref Range Status   WBC, UA 0-5 0 -  5 /hpf Final   RBC, UA None seen 0 -  2 /hpf Final   Epithelial Cells (non renal) 0-10 0 - 10 /hpf Final   Casts Present (A) None seen /lpf Final   Cast Type Hyaline casts N/A Final   Mucus, UA Present (A) Not Estab. Final   Bacteria, UA Few (A) None seen/Few Final    RADIOLOGY:  No results found.  EKG:   Orders placed or performed during the hospital encounter of 05/16/15  . EKG 12-Lead  . EKG 12-Lead  . ED EKG  . ED EKG  . EKG 12-Lead  . EKG 12-Lead  . ED EKG  . ED EKG      Management plans discussed with the patient, family and they are in agreement.  CODE STATUS:  Code Status History    Date Active Date Inactive Code Status Order ID Comments User Context   05/16/2015  5:32 PM 05/21/2015  7:49 PM Full Code [id number]  Marguarite Arbour, MD Inpatient    Advance Directive Documentation        Most Recent Value   Type of Advance Directive  Healthcare Power of Attorney, Living will   Pre-existing out of facility DNR order (yellow form or pink MOST form)     "MOST" Form in Place?        TOTAL TIME TAKING CARE OF THIS PATIENT: 35 minutes.     Altamese Dilling M.D on 05/25/2015 at 9:42 AM  Between 7am to 6pm - Pager - [phone]  After 6pm go to [email or web address] - password [email or web address] [facility]  [place] [facility]  Office  [phone]  CC: Primary care physician; Marisue Ivan, MD   Note: This dictation was prepared with Dragon dictation along with smaller phrase technology. Any transcriptional errors that result from this process are unintentional.

## 2015-05-25 NOTE — Patient Outreach (Addendum)
[facility]) Care Management  05/25/2015  Merri RayJerry G Tsukamoto 03/06/1931 [id number]   Transition of care   RNCM placed telephone call to [facility]igett, HCPOA of Mr.Bussey, as part of the transition of care program,reviewed purpose of call and my role, HIPPA verified. Arline AspCindy reports that [facility] and she will notify me when he is discharged home.  Verified that Raul Delindy Swigett had my contact information.  Plan Will continue to follow patient progress for discharge, via care everywhere and HCPOA return call, to begin transition of care program.  Egbert GaribaldiKimberly Sammi Stolarz, RN, [facility] Care Management [phone] Mobile [phone]- Toll Free Main Office

## 2015-05-29 ENCOUNTER — Encounter: Payer: Self-pay | Admitting: *Deleted

## 2015-05-29 ENCOUNTER — Other Ambulatory Visit: Payer: Self-pay | Admitting: *Deleted

## 2015-05-29 NOTE — Patient Outreach (Signed)
[facility]) Care Management  05/29/2015  Merri RayJerry G Casino 1932-02-27 [id number]   Transition of care call  RNCM placed call to Ronny FlurryJerry Herbst to telephone number listed, phone message stated not available. Placed call to patient's daughter POA, as noted in prior EPIC note patient has given verbal consent to speak with Rosemarie BeathCindy Swigget his daughter  HiPPA verified, Mrs.Swigget states that they are managing well at home , and declines [facility] services at this time, attempted to explain difference between home health and [facility] care [facility], But she requested to have our contact number for future needs.  Provided [facility] care [facility] contact number, Arline AspCindy recalls having information packet given to patient during hospitalization .  Plan Will close case and notify PCP, send patient closer letter and notify [facility] CMA, Vanita Inglesicole Robinson  Kimberly Glover, RN, [facility] Care Management [phone] Mobile [phone]- Toll Free Main Office .

## 2015-06-02 ENCOUNTER — Encounter: Payer: Self-pay | Admitting: Emergency Medicine

## 2015-06-02 ENCOUNTER — Emergency Department: Payer: PPO

## 2015-06-02 ENCOUNTER — Observation Stay
Admission: EM | Admit: 2015-06-02 | Discharge: 2015-06-04 | Disposition: A | Discharge status: Home / Self Care | Payer: PPO | Attending: Internal Medicine | Admitting: Internal Medicine

## 2015-06-02 DIAGNOSIS — F329 Major depressive disorder, single episode, unspecified: Secondary | ICD-10-CM | POA: Diagnosis not present

## 2015-06-02 DIAGNOSIS — R0789 Other chest pain: Secondary | ICD-10-CM

## 2015-06-02 DIAGNOSIS — H409 Unspecified glaucoma: Secondary | ICD-10-CM | POA: Diagnosis not present

## 2015-06-02 DIAGNOSIS — Z886 Allergy status to analgesic agent status: Secondary | ICD-10-CM | POA: Diagnosis not present

## 2015-06-02 DIAGNOSIS — R51 Headache: Secondary | ICD-10-CM | POA: Diagnosis not present

## 2015-06-02 DIAGNOSIS — N183 Chronic kidney disease, stage 3 (moderate): Secondary | ICD-10-CM | POA: Diagnosis not present

## 2015-06-02 DIAGNOSIS — M109 Gout, unspecified: Secondary | ICD-10-CM | POA: Diagnosis not present

## 2015-06-02 DIAGNOSIS — J90 Pleural effusion, not elsewhere classified: Secondary | ICD-10-CM | POA: Insufficient documentation

## 2015-06-02 DIAGNOSIS — Z794 Long term (current) use of insulin: Secondary | ICD-10-CM | POA: Insufficient documentation

## 2015-06-02 DIAGNOSIS — R410 Disorientation, unspecified: Secondary | ICD-10-CM | POA: Insufficient documentation

## 2015-06-02 DIAGNOSIS — D649 Anemia, unspecified: Secondary | ICD-10-CM | POA: Insufficient documentation

## 2015-06-02 DIAGNOSIS — Z6835 Body mass index (BMI) 35.0-35.9, adult: Secondary | ICD-10-CM | POA: Insufficient documentation

## 2015-06-02 DIAGNOSIS — E871 Hypo-osmolality and hyponatremia: Secondary | ICD-10-CM | POA: Diagnosis not present

## 2015-06-02 DIAGNOSIS — Z833 Family history of diabetes mellitus: Secondary | ICD-10-CM | POA: Insufficient documentation

## 2015-06-02 DIAGNOSIS — Z87891 Personal history of nicotine dependence: Secondary | ICD-10-CM | POA: Diagnosis not present

## 2015-06-02 DIAGNOSIS — Z823 Family history of stroke: Secondary | ICD-10-CM | POA: Diagnosis not present

## 2015-06-02 DIAGNOSIS — E1122 Type 2 diabetes mellitus with diabetic chronic kidney disease: Secondary | ICD-10-CM | POA: Insufficient documentation

## 2015-06-02 DIAGNOSIS — E785 Hyperlipidemia, unspecified: Secondary | ICD-10-CM | POA: Insufficient documentation

## 2015-06-02 DIAGNOSIS — J189 Pneumonia, unspecified organism: Secondary | ICD-10-CM | POA: Insufficient documentation

## 2015-06-02 DIAGNOSIS — Z888 Allergy status to other drugs, medicaments and biological substances status: Secondary | ICD-10-CM | POA: Insufficient documentation

## 2015-06-02 DIAGNOSIS — R079 Chest pain, unspecified: Principal | ICD-10-CM

## 2015-06-02 DIAGNOSIS — E860 Dehydration: Secondary | ICD-10-CM | POA: Diagnosis not present

## 2015-06-02 DIAGNOSIS — H269 Unspecified cataract: Secondary | ICD-10-CM | POA: Diagnosis not present

## 2015-06-02 DIAGNOSIS — K5900 Constipation, unspecified: Secondary | ICD-10-CM | POA: Diagnosis not present

## 2015-06-02 DIAGNOSIS — K5730 Diverticulosis of large intestine without perforation or abscess without bleeding: Secondary | ICD-10-CM | POA: Insufficient documentation

## 2015-06-02 DIAGNOSIS — Z8249 Family history of ischemic heart disease and other diseases of the circulatory system: Secondary | ICD-10-CM | POA: Insufficient documentation

## 2015-06-02 DIAGNOSIS — G9340 Encephalopathy, unspecified: Secondary | ICD-10-CM

## 2015-06-02 DIAGNOSIS — I129 Hypertensive chronic kidney disease with stage 1 through stage 4 chronic kidney disease, or unspecified chronic kidney disease: Secondary | ICD-10-CM | POA: Insufficient documentation

## 2015-06-02 DIAGNOSIS — L409 Psoriasis, unspecified: Secondary | ICD-10-CM | POA: Diagnosis not present

## 2015-06-02 DIAGNOSIS — E039 Hypothyroidism, unspecified: Secondary | ICD-10-CM | POA: Diagnosis not present

## 2015-06-02 DIAGNOSIS — Z9889 Other specified postprocedural states: Secondary | ICD-10-CM | POA: Diagnosis not present

## 2015-06-02 DIAGNOSIS — N400 Enlarged prostate without lower urinary tract symptoms: Secondary | ICD-10-CM | POA: Diagnosis not present

## 2015-06-02 LAB — CBC
HCT: 28.9 % — ABNORMAL LOW (ref 40.0–52.0)
Hemoglobin: 9.7 g/dL — ABNORMAL LOW (ref 13.0–18.0)
MCH: 30 pg (ref 26.0–34.0)
MCHC: 33.5 g/dL (ref 32.0–36.0)
MCV: 89.6 fL (ref 80.0–100.0)
PLATELETS: 466 10*3/uL — AB (ref 150–440)
RBC: 3.22 MIL/uL — AB (ref 4.40–5.90)
RDW: 13.4 % (ref 11.5–14.5)
WBC: 8.5 10*3/uL (ref 3.8–10.6)

## 2015-06-02 LAB — TROPONIN I: Troponin I: <0.03 ng/mL (ref <0.031)

## 2015-06-02 LAB — COMPREHENSIVE METABOLIC PANEL
ALBUMIN: 2.4 g/dL — AB (ref 3.5–5.0)
ALT: 33 U/L (ref 17–63)
AST: 45 U/L — AB (ref 15–41)
Alkaline Phosphatase: 110 U/L (ref 38–126)
Anion gap: 11 (ref 5–15)
BILIRUBIN TOTAL: 0.6 mg/dL (ref 0.3–1.2)
BUN: 33 mg/dL — AB (ref 6–20)
CHLORIDE: 100 mmol/L — AB (ref 101–111)
CO2: 21 mmol/L — AB (ref 22–32)
Calcium: 8.5 mg/dL — ABNORMAL LOW (ref 8.9–10.3)
Creatinine, Ser: 1.7 mg/dL — ABNORMAL HIGH (ref 0.61–1.24)
GFR calc Af Amer: 42 mL/min — ABNORMAL LOW (ref >60)
GFR calc non Af Amer: 36 mL/min — ABNORMAL LOW (ref >60)
GLUCOSE: 156 mg/dL — AB (ref 65–99)
POTASSIUM: 4.6 mmol/L (ref 3.5–5.1)
Sodium: 132 mmol/L — ABNORMAL LOW (ref 135–145)
Total Protein: 6.9 g/dL (ref 6.5–8.1)

## 2015-06-02 LAB — GLUCOSE, CAPILLARY
GLUCOSE-CAPILLARY: 56 mg/dL — AB (ref 65–99)
Glucose-Capillary: 121 mg/dL — ABNORMAL HIGH (ref 65–99)

## 2015-06-02 MED ORDER — LISINOPRIL 20 MG PO TABS
20.0000 mg | ORAL_TABLET | Freq: Every day | ORAL | Status: DC
  Administered 2015-06-02 – 2015-06-04 (×3): 20 mg via ORAL
  Filled 2015-06-02 (×3): qty 1

## 2015-06-02 MED ORDER — ACETAMINOPHEN 325 MG PO TABS
650.0000 mg | ORAL_TABLET | ORAL | Status: DC | PRN
  Administered 2015-06-04: 650 mg via ORAL
  Filled 2015-06-02: qty 2

## 2015-06-02 MED ORDER — INSULIN ASPART 100 UNIT/ML ~~LOC~~ SOLN
0.0000 [IU] | Freq: Three times a day (TID) | SUBCUTANEOUS | Status: DC
  Administered 2015-06-03: 1 [IU] via SUBCUTANEOUS
  Administered 2015-06-04: 2 [IU] via SUBCUTANEOUS
  Administered 2015-06-04: 1 [IU] via SUBCUTANEOUS
  Filled 2015-06-02 (×2): qty 1

## 2015-06-02 MED ORDER — LACTULOSE 10 GM/15ML PO SOLN
20.0000 g | Freq: Every day | ORAL | Status: DC | PRN
Start: 2015-06-02 — End: 2015-06-04

## 2015-06-02 MED ORDER — OXYCODONE-ACETAMINOPHEN 5-325 MG PO TABS
1.0000 | ORAL_TABLET | ORAL | Status: DC | PRN
  Administered 2015-06-02 – 2015-06-03 (×2): 1 via ORAL
  Filled 2015-06-02: qty 1

## 2015-06-02 MED ORDER — ALPRAZOLAM 0.25 MG PO TABS: 0.2500 mg | ORAL_TABLET | Freq: Two times a day (BID) | ORAL | Status: DC | PRN

## 2015-06-02 MED ORDER — GI COCKTAIL ~~LOC~~
5.0000 mL | ORAL | Status: DC | PRN
  Filled 2015-06-02: qty 30

## 2015-06-02 MED ORDER — SODIUM CHLORIDE 0.9 % IV SOLN
INTRAVENOUS | Status: DC
  Administered 2015-06-02 – 2015-06-03 (×2): via INTRAVENOUS

## 2015-06-02 MED ORDER — HEPARIN SODIUM (PORCINE) 5000 UNIT/ML IJ SOLN
5000.0000 [IU] | Freq: Three times a day (TID) | INTRAMUSCULAR | Status: DC
  Administered 2015-06-02 – 2015-06-04 (×5): 5000 [IU] via SUBCUTANEOUS
  Filled 2015-06-02 (×4): qty 1

## 2015-06-02 MED ORDER — POLYETHYLENE GLYCOL 3350 17 G PO PACK
17.0000 g | PACK | Freq: Two times a day (BID) | ORAL | Status: DC
  Administered 2015-06-02 – 2015-06-04 (×4): 17 g via ORAL
  Filled 2015-06-02 (×4): qty 1

## 2015-06-02 MED ORDER — OXYCODONE-ACETAMINOPHEN 5-325 MG PO TABS
ORAL_TABLET | ORAL | Status: AC
  Administered 2015-06-02: 1 via ORAL
  Filled 2015-06-02: qty 1

## 2015-06-02 MED ORDER — DOCUSATE SODIUM 100 MG PO CAPS
100.0000 mg | ORAL_CAPSULE | Freq: Two times a day (BID) | ORAL | Status: DC
  Administered 2015-06-02 – 2015-06-04 (×4): 100 mg via ORAL
  Filled 2015-06-02 (×4): qty 1

## 2015-06-02 MED ORDER — SODIUM CHLORIDE 0.9 % IV SOLN
Freq: Once | INTRAVENOUS | Status: AC
  Administered 2015-06-02: 16:00:00 via INTRAVENOUS

## 2015-06-02 MED ORDER — RISAQUAD PO CAPS
1.0000 | ORAL_CAPSULE | Freq: Two times a day (BID) | ORAL | Status: DC
  Administered 2015-06-02 – 2015-06-04 (×4): 1 via ORAL
  Filled 2015-06-02 (×4): qty 1

## 2015-06-02 MED ORDER — ONDANSETRON HCL 4 MG/2ML IJ SOLN: 4.0000 mg | Freq: Four times a day (QID) | INTRAMUSCULAR | Status: DC | PRN

## 2015-06-02 MED ORDER — LEVOFLOXACIN 750 MG PO TABS
750.0000 mg | ORAL_TABLET | Freq: Once | ORAL | Status: AC
  Administered 2015-06-02: 750 mg via ORAL
  Filled 2015-06-02: qty 1

## 2015-06-02 MED ORDER — QUINAPRIL HCL 10 MG PO TABS
20.0000 mg | ORAL_TABLET | Freq: Every day | ORAL | Status: DC
  Filled 2015-06-02: qty 2

## 2015-06-02 MED ORDER — SENNOSIDES-DOCUSATE SODIUM 8.6-50 MG PO TABS
2.0000 | ORAL_TABLET | Freq: Every day | ORAL | Status: DC
  Administered 2015-06-03 – 2015-06-04 (×2): 2 via ORAL
  Filled 2015-06-02 (×2): qty 2

## 2015-06-02 MED ORDER — CARVEDILOL 3.125 MG PO TABS
3.1250 mg | ORAL_TABLET | Freq: Two times a day (BID) | ORAL | Status: DC
  Administered 2015-06-03 – 2015-06-04 (×3): 3.125 mg via ORAL
  Filled 2015-06-02 (×4): qty 1

## 2015-06-02 MED ORDER — LEVOTHYROXINE SODIUM 50 MCG PO TABS
50.0000 ug | ORAL_TABLET | Freq: Every day | ORAL | Status: DC
  Administered 2015-06-03 – 2015-06-04 (×2): 50 ug via ORAL
  Filled 2015-06-02 (×2): qty 1

## 2015-06-02 MED ORDER — TAMSULOSIN HCL 0.4 MG PO CAPS
0.4000 mg | ORAL_CAPSULE | Freq: Every day | ORAL | Status: DC
  Administered 2015-06-03 – 2015-06-04 (×2): 0.4 mg via ORAL
  Filled 2015-06-02 (×2): qty 1

## 2015-06-02 MED ORDER — INSULIN ASPART 100 UNIT/ML ~~LOC~~ SOLN: 0.0000 [IU] | Freq: Every day | SUBCUTANEOUS | Status: DC

## 2015-06-02 MED ORDER — AMLODIPINE BESYLATE 5 MG PO TABS
5.0000 mg | ORAL_TABLET | Freq: Every day | ORAL | Status: DC
  Administered 2015-06-03 – 2015-06-04 (×2): 5 mg via ORAL
  Filled 2015-06-02 (×2): qty 1

## 2015-06-02 MED ORDER — PRAVASTATIN SODIUM 20 MG PO TABS
20.0000 mg | ORAL_TABLET | Freq: Every day | ORAL | Status: DC
  Administered 2015-06-03: 20 mg via ORAL
  Filled 2015-06-02: qty 1

## 2015-06-02 MED ORDER — GI COCKTAIL ~~LOC~~: 30.0000 mL | Freq: Four times a day (QID) | ORAL | Status: DC | PRN

## 2015-06-02 MED ORDER — MORPHINE SULFATE (PF) 4 MG/ML IV SOLN
4.0000 mg | Freq: Once | INTRAVENOUS | Status: AC
  Administered 2015-06-02: 4 mg via INTRAVENOUS
  Filled 2015-06-02: qty 1

## 2015-06-02 MED ORDER — SUCRALFATE 1 G PO TABS
1.0000 g | ORAL_TABLET | Freq: Three times a day (TID) | ORAL | Status: DC
  Administered 2015-06-02 – 2015-06-04 (×6): 1 g via ORAL
  Filled 2015-06-02 (×6): qty 1

## 2015-06-02 MED ORDER — INSULIN GLARGINE 100 UNIT/ML ~~LOC~~ SOLN
14.0000 [IU] | Freq: Every day | SUBCUTANEOUS | Status: DC
  Filled 2015-06-02 (×2): qty 0.14

## 2015-06-02 MED ORDER — CLOPIDOGREL BISULFATE 75 MG PO TABS
75.0000 mg | ORAL_TABLET | Freq: Every day | ORAL | Status: DC
  Administered 2015-06-02 – 2015-06-04 (×3): 75 mg via ORAL
  Filled 2015-06-02 (×3): qty 1

## 2015-06-02 MED ORDER — PSYLLIUM 95 % PO PACK
1.0000 | PACK | Freq: Every day | ORAL | Status: DC
  Administered 2015-06-04: 1 via ORAL
  Filled 2015-06-02 (×3): qty 1

## 2015-06-02 MED ORDER — ALLOPURINOL 100 MG PO TABS
300.0000 mg | ORAL_TABLET | Freq: Every day | ORAL | Status: DC
  Administered 2015-06-03 – 2015-06-04 (×2): 300 mg via ORAL
  Filled 2015-06-02: qty 3
  Filled 2015-06-02: qty 1

## 2015-06-02 MED ORDER — PANTOPRAZOLE SODIUM 40 MG PO TBEC
40.0000 mg | DELAYED_RELEASE_TABLET | Freq: Two times a day (BID) | ORAL | Status: DC
  Administered 2015-06-02 – 2015-06-04 (×4): 40 mg via ORAL
  Filled 2015-06-02 (×4): qty 1

## 2015-06-02 MED ORDER — MORPHINE SULFATE (PF) 2 MG/ML IV SOLN
2.0000 mg | INTRAVENOUS | Status: DC | PRN
  Administered 2015-06-03 (×2): 2 mg via INTRAVENOUS
  Filled 2015-06-02 (×2): qty 1

## 2015-06-02 NOTE — ED Provider Notes (Signed)
[facility] Emergency Department Provider Note     Time seen: ----------------------------------------- 2:42 PM on 06/02/2015 -----------------------------------------    I have reviewed the triage vital signs and the nursing notes.   HISTORY  Chief Complaint Chest Pain    HPI Juan Walker is a 82 y.o. male who presents to ER after being seen for chest pain. Patient was seen at [facility] today for chest pain, was sent here for further evaluation with possible EKG changes. Patient notes he's had sweats, nausea, shortness of breath and vague pain across the left side of his chest. Nothing makes his symptoms better or worse. He was recently admitted for rapid atrial fibrillation.   Past Medical History  Diagnosis Date  . Essential (primary) hypertension 02/16/2015  . Diverticular disease of large intestine 02/16/2015  . Acquired hypothyroidism 02/16/2015  . Diabetes mellitus (HCC) 02/16/2015    Overview:  insulin 2005 last eye exam 01/2013 [facility]   . Depression   . Glaucoma   . Gout   . Renal disorder   . BPH (benign prostatic hyperplasia)   . BPH (benign prostatic hypertrophy)   . Cataract     Right eye  . Anemia     chronic  . Diverticulosis of colon without hemorrhage   . Gout   . History of prostatitis   . Morbid obesity (HCC)   . Psoriasis   . Shingles   . Renal insufficiency     Patient Active Problem List   Diagnosis Date Noted  . Syncope and collapse 05/16/2015  . SVT (supraventricular tachycardia) (HCC) 05/16/2015  . Hyponatremia 05/16/2015  . Diarrhea 05/16/2015  . Notalgia 03/29/2015  . Renal cyst, left 03/29/2015  . Left sided abdominal pain 03/29/2015  . Acquired hypothyroidism 02/16/2015  . Benign fibroma of prostate 02/16/2015  . Cataract 02/16/2015  . Chronic anemia 02/16/2015  . Diverticular disease of large intestine 02/16/2015  . Essential (primary) hypertension 02/16/2015  . Gout 02/16/2015   . Morbid obesity (HCC) 02/16/2015  . Psoriasis 02/16/2015  . Diabetes mellitus (HCC) 02/16/2015  . Microhematuria 02/16/2015  . Renal cyst 02/16/2015  . Type 2 diabetes mellitus (HCC) 08/06/2014    Past Surgical History  Procedure Laterality Date  . Nephrolithotomy  1972  . Sinus irrigation    . Excision benign skin lesion ears    . Kidney stones removed    . Esophagogastroduodenoscopy (egd) with propofol N/A 04/27/2015    Procedure: ESOPHAGOGASTRODUODENOSCOPY (EGD) WITH PROPOFOL;  Surgeon: Scot Jun, MD;  Location: [facility] ENDOSCOPY;  Service: Endoscopy;  Laterality: N/A;    Allergies Aspirin; Metformin; Pioglitazone; and Rosiglitazone  Social History Social History  Substance Use Topics  . Smoking status: Former Games developer  . Smokeless tobacco: Current User    Types: Chew  . Alcohol Use: No    Review of Systems Constitutional: Negative for fever. Eyes: Negative for visual changes. ENT: Negative for sore throat. Cardiovascular: Positive for chest pain Respiratory: Positive for shortness of breath Gastrointestinal: Negative for abdominal pain, positive for nausea Genitourinary: Negative for dysuria. Musculoskeletal: Negative for back pain. Skin: Negative for rash. Neurological: Negative for headaches, focal weakness or numbness.  10-point ROS otherwise negative.  ____________________________________________   PHYSICAL EXAM:  VITAL SIGNS: ED Triage Vitals  Enc Vitals Group     BP --      Pulse --      Resp --      Temp --      Temp src --  SpO2 --      Weight --      Height --      Head Cir --      Peak Flow --      Pain Score --      Pain Loc --      Pain Edu? --      Excl. in GC? --     Constitutional: Alert and oriented. Mild distress Eyes: Conjunctivae are normal. PERRL. Normal extraocular movements. ENT   Head: Normocephalic and atraumatic.   Nose: No congestion/rhinnorhea.   Mouth/Throat: Mucous membranes are moist.    Neck: No stridor. Cardiovascular: Rapid rate, regular rhythm. Normal and symmetric distal pulses are present in all extremities. No murmurs, rubs, or gallops. Respiratory: Normal respiratory effort without tachypnea nor retractions. Breath sounds are clear and equal bilaterally. No wheezes/rales/rhonchi. Gastrointestinal: Soft and nontender. No distention. No abdominal bruits.  Musculoskeletal: Nontender with normal range of motion in all extremities. No joint effusions.  No lower extremity tenderness nor edema. Neurologic:  Normal speech and language. No gross focal neurologic deficits are appreciated. He has gait instability Skin:  Skin is warm, dry and intact. No rash noted. Psychiatric: Mood and affect are normal. Speech and behavior are normal. Patient exhibits appropriate insight and judgment. ____________________________________________  EKG: Interpreted by me. Sinus tachycardia with a rate of 102 bpm, normal PR interval, normal QRS, normal QT interval. Old inferior and anterior Q wave changes  ____________________________________________  ED COURSE:  Pertinent labs & imaging results that were available during my care of the patient were reviewed by me and considered in my medical decision making (see chart for details). Patient is in no acute distress, will check cardiac labs and reevaluate. ____________________________________________    LABS (pertinent positives/negatives)  Labs Reviewed  CBC - Abnormal; Notable for the following:    RBC 3.22 (*)    Hemoglobin 9.7 (*)    HCT 28.9 (*)    Platelets 466 (*)    All other components within normal limits  COMPREHENSIVE METABOLIC PANEL - Abnormal; Notable for the following:    Sodium 132 (*)    Chloride 100 (*)    CO2 21 (*)    Glucose, Bld 156 (*)    BUN 33 (*)    Creatinine, Ser 1.70 (*)    Calcium 8.5 (*)    Albumin 2.4 (*)    AST 45 (*)    GFR calc non Af Amer 36 (*)    GFR calc Af Amer 42 (*)    All other components  within normal limits  TROPONIN I    RADIOLOGY  Chest x-ray  IMPRESSION: Opacities in the left lung have developed with a suspected pleural effusion worrisome for pneumonia. Followup PA and lateral chest X-ray is recommended in 3-4 weeks following trial of antibiotic therapy to ensure resolution and exclude underlying malignancy. ____________________________________________  FINAL ASSESSMENT AND PLAN  Chest pain  Plan: Patient with labs and imaging as dictated above. Patient presents to ER with chest pain and nonspecific symptoms. Patient possibly has pneumonia on x-ray, he's been started on Levaquin. His primary care doctor sent him here for for possible stress testing or heart catheter. I will discuss with the hospitalist for admission.   Emily FilbertWilliams, Carrick Rijos E, MD   Emily FilbertJonathan E Cirilo Canner, MD 06/02/15 [phone]

## 2015-06-02 NOTE — H&P (Addendum)
[facility] Physicians - [place] at [facility]   PATIENT NAME: Juan Walker    MR#:  [id number]  DATE OF BIRTH:  12-03-1931  DATE OF ADMISSION:  06/02/2015  PRIMARY CARE PHYSICIAN: Marisue Ivan, MD   REQUESTING/REFERRING PHYSICIAN: Emily Filbert, MD  CHIEF COMPLAINT:   Chief Complaint  Patient presents with  . Chest Pain  Intermittent left chest pain for 1 month  HISTORY OF PRESENT ILLNESS:  Jeray Shugart  is a 82 y.o. male with a known history of Hypertension, diabetes, renal disorder and anemia. The patient has had left-sided chest pain on and off for the past 1 month, which is burning and sharp, 10 out of 10 most without radiation. Patient denies any fever or chills, no orthopnea or nocturnal dyspnea or leg edema. He denies any cough, sputum or shortness of breath. Patient had A. fib with RVR recently, was admitted in [facility], but didn't get any cardiac workup. Chest x-ray showed questionable pneumonia. His primary care physician sent the patient here for possible stress test and heart catheter.  PAST MEDICAL HISTORY:   Past Medical History  Diagnosis Date  . Essential (primary) hypertension 02/16/2015  . Diverticular disease of large intestine 02/16/2015  . Acquired hypothyroidism 02/16/2015  . Diabetes mellitus (HCC) 02/16/2015    Overview:  insulin 2005 last eye exam 01/2013 [facility]   . Depression   . Glaucoma   . Gout   . Renal disorder   . BPH (benign prostatic hyperplasia)   . BPH (benign prostatic hypertrophy)   . Cataract     Right eye  . Anemia     chronic  . Diverticulosis of colon without hemorrhage   . Gout   . History of prostatitis   . Morbid obesity (HCC)   . Psoriasis   . Shingles   . Renal insufficiency     PAST SURGICAL HISTORY:   Past Surgical History  Procedure Laterality Date  . Nephrolithotomy  1972  . Sinus irrigation    . Excision benign skin lesion ears    . Kidney stones removed    .  Esophagogastroduodenoscopy (egd) with propofol N/A 04/27/2015    Procedure: ESOPHAGOGASTRODUODENOSCOPY (EGD) WITH PROPOFOL;  Surgeon: Scot Jun, MD;  Location: [facility] ENDOSCOPY;  Service: Endoscopy;  Laterality: N/A;    SOCIAL HISTORY:   Social History  Substance Use Topics  . Smoking status: Former Games developer  . Smokeless tobacco: Current User    Types: Chew  . Alcohol Use: No    FAMILY HISTORY:   Family History  Problem Relation Age of Onset  . Hematuria Neg Hx   . Kidney cancer Neg Hx   . Prostate cancer Neg Hx   . Sickle cell anemia Neg Hx   . Tuberculosis Neg Hx   . Diabetes Mellitus II Mother   . Hypertension Mother   . Stroke Father     DRUG ALLERGIES:   Allergies  Allergen Reactions  . Aspirin Nausea And Vomiting and Other (See Comments)    Reaction:  Constipation  . Metformin Nausea And Vomiting and Other (See Comments)    Pt states that this medication makes his blood clot.    . Pioglitazone Other (See Comments)    Reaction:  Left-sided weakness  . Rosiglitazone Other (See Comments)    Reaction:  Left-sided weakness    REVIEW OF SYSTEMS:  CONSTITUTIONAL: No fever,Poor oral intake and generalized weakness.  EYES: No blurred or double vision.  EARS, NOSE, AND  THROAT: No tinnitus or ear pain.  RESPIRATORY: No cough, shortness of breath, wheezing or hemoptysis.  CARDIOVASCULAR: Has chest pain, no orthopnea, edema.  GASTROINTESTINAL: No nausea, vomiting, diarrhea or abdominal pain. No melena or bloody stool, but has constipation for 3 days. GENITOURINARY: No dysuria, hematuria.  ENDOCRINE: No polyuria, nocturia,  HEMATOLOGY: No anemia, easy bruising or bleeding SKIN: No rash or lesion. MUSCULOSKELETAL: No joint pain or arthritis.   NEUROLOGIC: No tingling, numbness, weakness.  PSYCHIATRY: No anxiety or depression.   MEDICATIONS AT HOME:   Prior to Admission medications   Medication Sig Start Date End Date Taking? Authorizing Provider  allopurinol  (ZYLOPRIM) 300 MG tablet Take 300 mg by mouth daily.    Yes Historical Provider, MD  Alum & Mag Hydroxide-Simeth (GI COCKTAIL) SUSP suspension Take 5 mLs by mouth every 4 (four) hours as needed for indigestion.    Yes Historical Provider, MD  amLODipine (NORVASC) 5 MG tablet Take 5 mg by mouth daily.   Yes Historical Provider, MD  b complex vitamins tablet Take 1 tablet by mouth daily.   Yes Historical Provider, MD  carvedilol (COREG) 3.125 MG tablet Take 1 tablet (3.125 mg total) by mouth 2 (two) times daily with a meal. 05/21/15  Yes Altamese DillingVaibhavkumar Vachhani, MD  docusate sodium (COLACE) 100 MG capsule Take 100 mg by mouth 2 (two) times daily.   Yes Historical Provider, MD  insulin aspart protamine- aspart (NOVOLOG MIX 70/30) (70-30) 100 UNIT/ML injection Inject 10-12 Units into the skin 2 (two) times daily with a meal.    Yes Historical Provider, MD  insulin glargine (LANTUS) 100 UNIT/ML injection Inject 14 Units into the skin at bedtime.   Yes Historical Provider, MD  lactulose (CHRONULAC) 10 GM/15ML solution Take 20 g by mouth daily as needed for mild constipation.    Yes Historical Provider, MD  levothyroxine (SYNTHROID, LEVOTHROID) 50 MCG tablet Take 50 mcg by mouth daily before breakfast.    Yes Historical Provider, MD  lovastatin (MEVACOR) 20 MG tablet Take 20 mg by mouth at bedtime.    Yes Historical Provider, MD  oxyCODONE-acetaminophen (PERCOCET/ROXICET) 5-325 MG tablet Take 1 tablet by mouth every 4 (four) hours as needed for moderate pain. 05/21/15  Yes Altamese DillingVaibhavkumar Vachhani, MD  pantoprazole (PROTONIX) 40 MG tablet Take 40 mg by mouth 2 (two) times daily.    Yes Historical Provider, MD  polyethylene glycol (MIRALAX / GLYCOLAX) packet Take 17 g by mouth 2 (two) times daily. 05/21/15  Yes Altamese DillingVaibhavkumar Vachhani, MD  psyllium (REGULOID) 0.52 g capsule Take 0.52 g by mouth daily.   Yes Historical Provider, MD  quinapril (ACCUPRIL) 20 MG tablet Take 20 mg by mouth daily.    Yes Historical Provider,  MD  saccharomyces boulardii (FLORASTOR) 250 MG capsule Take 250 mg by mouth 2 (two) times daily.   Yes Historical Provider, MD  senna-docusate (SENOKOT-S) 8.6-50 MG tablet Take 2 tablets by mouth daily.   Yes Historical Provider, MD  sucralfate (CARAFATE) 1 g tablet Take 1 g by mouth 4 (four) times daily -  with meals and at bedtime.   Yes Historical Provider, MD  tamsulosin (FLOMAX) 0.4 MG CAPS capsule Take 0.4 mg by mouth daily after breakfast.    Yes Historical Provider, MD  diazepam (VALIUM) 5 MG tablet Take 1 tablet (5 mg total) by mouth every 6 (six) hours as needed for anxiety or muscle spasms. Patient not taking: Reported on 05/16/2015 05/05/15   Crist FatBenjamin W Herrick, MD  ondansetron [facility](ZOFRAN) 4 MG  tablet Take 1 tablet (4 mg total) by mouth daily as needed for nausea or vomiting. Patient not taking: Reported on 06/02/2015 03/27/15   Darien Ramus, MD  traMADol (ULTRAM) 50 MG tablet Take 1-2 tablets (50-100 mg total) by mouth every 6 (six) hours as needed for moderate pain. Patient not taking: Reported on 05/16/2015 03/24/15   Crist Fat, MD      VITAL SIGNS:  Blood pressure 156/65, pulse 96, temperature 98.3 F (36.8 C), temperature source Oral, resp. rate 20, height  (1.651 m), weight 103.874 kg (229 lb), SpO2 97 %.  PHYSICAL EXAMINATION:  GENERAL:  82 y.o.-year-old patient lying in the bed with no acute distress. Obese. EYES: Pupils equal, round, reactive to light and accommodation. No scleral icterus. Extraocular muscles intact.  HEENT: Head atraumatic, normocephalic. Oropharynx and nasopharynx clear.  NECK:  Supple, no jugular venous distention. No thyroid enlargement, no tenderness.  LUNGS: Normal breath sounds bilaterally, no wheezing, rales,rhonchi or crepitation. No use of accessory muscles of respiration.  CARDIOVASCULAR: S1, S2 normal. No murmurs, rubs, or gallops. Tenderness on the left side chest wall. ABDOMEN: Soft, tenderness on the left upper quadrant,  nondistended. Bowel sounds present. No organomegaly or mass.  EXTREMITIES: No pedal edema, cyanosis, or clubbing.  NEUROLOGIC: Cranial nerves II through XII are intact. Muscle strength 5/5 in all extremities. Sensation intact. Gait not checked.  PSYCHIATRIC: The patient is alert and oriented x 3.  SKIN: No obvious rash, lesion, or ulcer.   LABORATORY PANEL:   CBC  Recent Labs Lab 06/02/15 1449  WBC 8.5  HGB 9.7*  HCT 28.9*  PLT 466*   ------------------------------------------------------------------------------------------------------------------  Chemistries   Recent Labs Lab 06/02/15 1449  NA 132*  K 4.6  CL 100*  CO2 21*  GLUCOSE 156*  BUN 33*  CREATININE 1.70*  CALCIUM 8.5*  AST 45*  ALT 33  ALKPHOS 110  BILITOT 0.6   ------------------------------------------------------------------------------------------------------------------  Cardiac Enzymes  Recent Labs Lab 06/02/15 1449  TROPONINI <0.03   ------------------------------------------------------------------------------------------------------------------  RADIOLOGY:  Dg Chest Port 1 View  06/02/2015  CLINICAL DATA:  Chest pain for 2 weeks EXAM: PORTABLE CHEST 1 VIEW COMPARISON:  05/16/2015 FINDINGS: Heterogeneous opacities in the left mid and lower lung zone have developed. Left pleural effusion is suspected. Right lung is clear. Normal heart size. IMPRESSION: Opacities in the left lung have developed with a suspected pleural effusion worrisome for pneumonia. Followup PA and lateral chest X-ray is recommended in 3-4 weeks following trial of antibiotic therapy to ensure resolution and exclude underlying malignancy. Electronically Signed   By: Jolaine Click M.D.   On: 06/02/2015 15:00    EKG:   Orders placed or performed during the hospital encounter of 06/02/15  . ED EKG  . ED EKG    IMPRESSION AND PLAN:   Atypical chest pain Placed for observation, Telemetry monitor, follow-up troponin level and  get a stress test. The patient is allergic to aspirin, I will start Plavix and continue lovastatin.  CKD stage III with mild dehydration. Encourage oral fluid intake. Start IV fluid.  Hypertension. Continue hypertension medication. Diabetes 2. Start sliding scale and continue lantus.  Constipation. Continue constipation medication.  Anemia of chronic disease. Hemoglobin is stable. Hyponatremia. It appears chronic.  All the records are reviewed and case discussed with ED provider. Management plans discussed with the patient, His wife and they are in agreement.  CODE STATUS: full code.  TOTAL TIME TAKING CARE OF THIS PATIENT:  48 minutes.  Shaune Pollack M.D on 06/02/2015 at 4:15 PM  Between 7am to 6pm - Pager - [phone]  After 6pm go to [email or web address] - password [email or web address] [facility]  [facility]  Office  [phone]  CC: Primary care physician; Marisue Ivan, MD

## 2015-06-02 NOTE — ED Notes (Signed)
Patient arrives to ED with c/o chest pain x2 weeks. Today, pain is getting much worse. Patient was seen at his PCP and they told him to come to the ED. [facility] and chest pain. Patient is A&O x4.

## 2015-06-02 NOTE — ED Notes (Signed)
Pt has started to report pain again and is currently requesting more pain medication.

## 2015-06-03 ENCOUNTER — Observation Stay: Payer: PPO

## 2015-06-03 ENCOUNTER — Encounter: Payer: Self-pay | Admitting: Radiology

## 2015-06-03 LAB — BASIC METABOLIC PANEL
ANION GAP: 7 (ref 5–15)
BUN: 30 mg/dL — ABNORMAL HIGH (ref 6–20)
CALCIUM: 8.1 mg/dL — AB (ref 8.9–10.3)
CO2: 21 mmol/L — AB (ref 22–32)
Chloride: 104 mmol/L (ref 101–111)
Creatinine, Ser: 1.48 mg/dL — ABNORMAL HIGH (ref 0.61–1.24)
GFR calc non Af Amer: 43 mL/min — ABNORMAL LOW (ref >60)
GFR, EST AFRICAN AMERICAN: 49 mL/min — AB (ref >60)
Glucose, Bld: 110 mg/dL — ABNORMAL HIGH (ref 65–99)
Potassium: 4.2 mmol/L (ref 3.5–5.1)
Sodium: 132 mmol/L — ABNORMAL LOW (ref 135–145)

## 2015-06-03 LAB — NM MYOCAR MULTI W/SPECT W/WALL MOTION / EF
CHL CUP NUCLEAR SDS: 9
CHL CUP NUCLEAR SRS: 0
CHL CUP NUCLEAR SSS: 9
CSEPED: 1 min
Estimated workload: 1 METS
Exercise duration (sec): 4 s
LV dias vol: 72 mL (ref 62–150)
LVSYSVOL: 18 mL
Peak HR: 122 {beats}/min
Rest HR: 93 {beats}/min
TID: 0.9

## 2015-06-03 LAB — GLUCOSE, CAPILLARY
GLUCOSE-CAPILLARY: 127 mg/dL — AB (ref 65–99)
GLUCOSE-CAPILLARY: 109 mg/dL — AB (ref 65–99)
GLUCOSE-CAPILLARY: 114 mg/dL — AB (ref 65–99)
GLUCOSE-CAPILLARY: 111 mg/dL — AB (ref 65–99)
Glucose-Capillary: 141 mg/dL — ABNORMAL HIGH (ref 65–99)

## 2015-06-03 LAB — TROPONIN I: Troponin I: <0.03 ng/mL (ref <0.031)

## 2015-06-03 MED ORDER — TECHNETIUM TC 99M SESTAMIBI - CARDIOLITE
31.9600 | Freq: Once | INTRAVENOUS | Status: AC | PRN
  Administered 2015-06-03: 12:00:00 31.96 via INTRAVENOUS

## 2015-06-03 MED ORDER — REGADENOSON 0.4 MG/5ML IV SOLN
0.4000 mg | Freq: Once | INTRAVENOUS | Status: AC
  Administered 2015-06-03: 0.4 mg via INTRAVENOUS
  Filled 2015-06-03: qty 5

## 2015-06-03 MED ORDER — LEVOFLOXACIN 750 MG PO TABS: 750.0000 mg | ORAL_TABLET | ORAL | Status: DC

## 2015-06-03 MED ORDER — INSULIN GLARGINE 100 UNIT/ML ~~LOC~~ SOLN: 7.0000 [IU] | Freq: Every day | SUBCUTANEOUS | Status: DC

## 2015-06-03 MED ORDER — TECHNETIUM TC 99M SESTAMIBI - CARDIOLITE
13.0000 | Freq: Once | INTRAVENOUS | Status: AC | PRN
  Administered 2015-06-03: 11:00:00 13.69 via INTRAVENOUS

## 2015-06-03 MED ORDER — INSULIN GLARGINE 100 UNIT/ML ~~LOC~~ SOLN
7.0000 [IU] | Freq: Every day | SUBCUTANEOUS | Status: DC
  Administered 2015-06-04: 7 [IU] via SUBCUTANEOUS
  Filled 2015-06-03 (×3): qty 0.07

## 2015-06-03 MED ORDER — LEVOFLOXACIN 750 MG PO TABS: 750.0000 mg | ORAL_TABLET | Freq: Every day | ORAL | Status: DC

## 2015-06-03 MED ORDER — ONDANSETRON HCL 4 MG/2ML IJ SOLN
4.0000 mg | Freq: Once | INTRAMUSCULAR | Status: AC
  Administered 2015-06-03: 4 mg via INTRAVENOUS
  Filled 2015-06-03: qty 2

## 2015-06-03 NOTE — Progress Notes (Signed)
A&O at beginning of shift then woke up confused during the night. Patient easily redirected. IV fluids infusing. Morphine given for pain. Stress test today.

## 2015-06-03 NOTE — Progress Notes (Signed)
[facility]  Patient Status: Active  [facility] is providing the following services: SN/PT/OT/HHA  If patient discharges after hours, please call [phone].   Dimple CaseyJason E Hinton 06/03/2015, 9:36 AM

## 2015-06-03 NOTE — Progress Notes (Signed)
Inpatient Diabetes Program Recommendations  AACE/ADA: New Consensus Statement on Inpatient Glycemic Control (2015)  Target Ranges:  Prepandial:   less than 140 mg/dL      Peak postprandial:   less than 180 mg/dL (1-2 hours)      Critically ill patients:  140 - 180 mg/dL  Results for Juan Walker, [name] (MRN [id number]) as of 06/03/2015 09:09  Ref. Range 06/02/2015 21:41 06/02/2015 22:13 06/03/2015 07:46  Glucose-Capillary Latest Ref Range: 65-99 mg/dL 56 (L) 409121 (H) 811109 (H)   Review of Glycemic Control  Diabetes history: DM2 Outpatient Diabetes medications: Lantus 14 units QHS Current orders for Inpatient glycemic control: Lantus 14 units QHS, Novolog 0-9 units TID with meals, Novolog 0-5 units QHS  Inpatient Diabetes Program Recommendations: Insulin - Basal: Glucose down to 56 mg/dl last night 91:4721:41. Noted patient did not receive Lantus last night at bedtime and fasting glucose is 109 mg/dl today. Please consider decreasing Lantus to 7 units QHS.  Thanks, Orlando PennerMarie Allysa Governale, RN, MSN, CDE Diabetes Coordinator Inpatient Diabetes Program [phone] (Team Pager from 8am to 5pm) [phone] ([facility] office) [phone] [facility] office) [phone] [facility] office)

## 2015-06-03 NOTE — Progress Notes (Signed)
Tried several times to void and was unable. Patient moaning and complaining of pain due to inability to void. Bladder scan showed 259. Dr. Anne HahnWillis notified. In and out cath ordered and given. 200 cc of urine out.
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
## 2015-06-03 NOTE — Progress Notes (Signed)
ANTIBIOTIC CONSULT NOTE - INITIAL  Pharmacy Consult for Levaquin  Indication: pneumonia  Allergies  Allergen Reactions  . Aspirin Nausea And Vomiting and Other (See Comments)    Reaction:  Constipation  . Metformin Nausea And Vomiting and Other (See Comments)    Pt states that this medication makes his blood clot.    . Pioglitazone Other (See Comments)    Reaction:  Left-sided weakness  . Rosiglitazone Other (See Comments)    Reaction:  Left-sided weakness    Patient Measurements: Height:  (165.1 cm) Weight: 211 lb 6.4 oz (95.89 kg) IBW/kg (Calculated) : 61.5 Adjusted Body Weight:   Vital Signs: Temp: 98.2 F (36.8 C) (04/05 1915) Temp Source: Oral (04/05 1915) BP: 145/53 mmHg (04/05 1915) Pulse Rate: 79 (04/05 1915) Intake/Output from previous day: 04/04 0701 - 04/05 0700 In: -  Out: 750 [Urine:750] Intake/Output from this shift:    Labs:  Recent Labs  06/02/15 1449 06/03/15 0125  WBC 8.5  --   HGB 9.7*  --   PLT 466*  --   CREATININE 1.70* 1.48*   Estimated Creatinine Clearance: 41.7 mL/min (by C-G formula based on Cr of 1.48). No results for input(s): VANCOTROUGH, VANCOPEAK, VANCORANDOM, GENTTROUGH, GENTPEAK, GENTRANDOM, TOBRATROUGH, TOBRAPEAK, TOBRARND, AMIKACINPEAK, AMIKACINTROU, AMIKACIN in the last 72 hours.   Microbiology: No results found for this or any previous visit (from the past 720 hour(s)).  Medical History: Past Medical History  Diagnosis Date  . Essential (primary) hypertension 02/16/2015  . Diverticular disease of large intestine 02/16/2015  . Acquired hypothyroidism 02/16/2015  . Diabetes mellitus (HCC) 02/16/2015    Overview:  insulin 2005 last eye exam 01/2013 [facility]   . Depression   . Glaucoma   . Gout   . Renal disorder   . BPH (benign prostatic hyperplasia)   . BPH (benign prostatic hypertrophy)   . Cataract     Right eye  . Anemia     chronic  . Diverticulosis of colon without hemorrhage   . Gout   .  History of prostatitis   . Morbid obesity (HCC)   . Psoriasis   . Shingles   . Renal insufficiency     Medications:  Prescriptions prior to admission  Medication Sig Dispense Refill Last Dose  . allopurinol (ZYLOPRIM) 300 MG tablet Take 300 mg by mouth daily.    06/02/2015 at Unknown time  . Alum & Mag Hydroxide-Simeth (GI COCKTAIL) SUSP suspension Take 5 mLs by mouth every 4 (four) hours as needed for indigestion.    Past Month at Unknown time  . amLODipine (NORVASC) 5 MG tablet Take 5 mg by mouth daily.   06/02/2015 at Unknown time  . b complex vitamins tablet Take 1 tablet by mouth daily.   06/02/2015 at Unknown time  . carvedilol (COREG) 3.125 MG tablet Take 1 tablet (3.125 mg total) by mouth 2 (two) times daily with a meal. 60 tablet 0 06/02/2015 at 0830  . docusate sodium (COLACE) 100 MG capsule Take 100 mg by mouth 2 (two) times daily.   06/02/2015 at Unknown time  . insulin aspart protamine- aspart (NOVOLOG MIX 70/30) (70-30) 100 UNIT/ML injection Inject 10-12 Units into the skin 2 (two) times daily with a meal.    06/02/2015 at Unknown time  . insulin glargine (LANTUS) 100 UNIT/ML injection Inject 14 Units into the skin at bedtime.   06/01/2015 at Unknown time  . lactulose (CHRONULAC) 10 GM/15ML solution Take 20 g by mouth daily as needed  for mild constipation.    Past Week at Unknown time  . levothyroxine (SYNTHROID, LEVOTHROID) 50 MCG tablet Take 50 mcg by mouth daily before breakfast.    06/02/2015 at Unknown time  . lovastatin (MEVACOR) 20 MG tablet Take 20 mg by mouth at bedtime.    06/01/2015 at Unknown time  . oxyCODONE-acetaminophen (PERCOCET/ROXICET) 5-325 MG tablet Take 1 tablet by mouth every 4 (four) hours as needed for moderate pain. 40 tablet 0 06/02/2015 at 0900  . pantoprazole (PROTONIX) 40 MG tablet Take 40 mg by mouth 2 (two) times daily.    06/02/2015 at Unknown time  . polyethylene glycol (MIRALAX / GLYCOLAX) packet Take 17 g by mouth 2 (two) times daily. 14 each 0 06/02/2015 at Unknown  time  . psyllium (REGULOID) 0.52 g capsule Take 0.52 g by mouth daily.   06/02/2015 at Unknown time  . quinapril (ACCUPRIL) 20 MG tablet Take 20 mg by mouth daily.    06/02/2015 at Unknown time  . saccharomyces boulardii (FLORASTOR) 250 MG capsule Take 250 mg by mouth 2 (two) times daily.   06/02/2015 at Unknown time  . senna-docusate (SENOKOT-S) 8.6-50 MG tablet Take 2 tablets by mouth daily.   06/02/2015 at Unknown time  . sucralfate (CARAFATE) 1 g tablet Take 1 g by mouth 4 (four) times daily -  with meals and at bedtime.   06/02/2015 at Unknown time  . tamsulosin (FLOMAX) 0.4 MG CAPS capsule Take 0.4 mg by mouth daily after breakfast.    06/02/2015 at Unknown time  . diazepam (VALIUM) 5 MG tablet Take 1 tablet (5 mg total) by mouth every 6 (six) hours as needed for anxiety or muscle spasms. (Patient not taking: Reported on 05/16/2015) 30 tablet 0 Not Taking at Unknown time  . ondansetron (ZOFRAN) 4 MG tablet Take 1 tablet (4 mg total) by mouth daily as needed for nausea or vomiting. (Patient not taking: Reported on 06/02/2015) 10 tablet 0 Taking  . traMADol (ULTRAM) 50 MG tablet Take 1-2 tablets (50-100 mg total) by mouth every 6 (six) hours as needed for moderate pain. (Patient not taking: Reported on 05/16/2015) 50 tablet 0 Not Taking at Unknown time   Assessment: CrCl = 41.7 ml/min   Goal of Therapy:  resolution of infection  Plan:  Expected duration 7 days with resolution of temperature and/or normalization of WBC   Levaquin 750 mg IV Q48H currently ordered.  No dose adjustment needed.   Jamesen Stahnke D 06/03/2015,8:38 PM

## 2015-06-03 NOTE — Care Management (Addendum)
Patient was recently discharged form [facility] 3/23. He resides with his daughter.  No need for any DME.  Current with his pcp Dr Burnadette PopLinthavong.  Contacted attending to obtain order for resumption of all services being provided by [facility] (SN OT PT and Aide)- there is order present for PT and SN only.  Notified [facility].  Patient placed in observation for chest pain.  To discharge home if stress is negative.  Troponins negative.  CM has made 3 attempts thus far to deliver observation notice and patient has not been  available

## 2015-06-03 NOTE — Progress Notes (Signed)
Spoke with patients grandson earlier this evening who says that patient is more confused this evening then baseline. He was driving over the weekend and doing well intil Sunday when they noticed a lot of confusion. Due to this discharge held and head CT ordered which was negative for acute CVA.  I called patients room tonight, but did not get an answer to let family know about HEAD CT results.  TSH and B12 pending

## 2015-06-03 NOTE — Progress Notes (Signed)
[facility] [place] at [facility]   PATIENT NAME: Juan Walker    MR#:  [id number]  DATE OF BIRTH:  1931/12/31  SUBJECTIVE:  Patient with some confusion this am denies CP I called patients daughter who just saw him this am and says that he was at his baseline.   REVIEW OF SYSTEMS:    Review of Systems  Constitutional: Negative for fever, chills and malaise/fatigue.  HENT: Negative for ear discharge, ear pain, hearing loss, nosebleeds and sore throat.   Eyes: Negative for blurred vision and pain.  Respiratory: Negative for cough, hemoptysis, shortness of breath and wheezing.   Cardiovascular: Negative for chest pain, palpitations and leg swelling.  Gastrointestinal: Negative for nausea, vomiting, abdominal pain, diarrhea and blood in stool.  Genitourinary: Negative for dysuria.  Musculoskeletal: Negative for back pain.  Neurological: Negative for dizziness, tremors, speech change, focal weakness, seizures and headaches.  Endo/Heme/Allergies: Does not bruise/bleed easily.  Psychiatric/Behavioral: Positive for memory loss. Negative for depression, suicidal ideas and hallucinations.    Tolerating Diet:yes      DRUG ALLERGIES:   Allergies  Allergen Reactions  . Aspirin Nausea And Vomiting and Other (See Comments)    Reaction:  Constipation  . Metformin Nausea And Vomiting and Other (See Comments)    Pt states that this medication makes his blood clot.    . Pioglitazone Other (See Comments)    Reaction:  Left-sided weakness  . Rosiglitazone Other (See Comments)    Reaction:  Left-sided weakness    VITALS:  Blood pressure 145/53, pulse 79, temperature 98.2 F (36.8 C), temperature source Oral, resp. rate 18, height  (1.651 m), weight 95.89 kg (211 lb 6.4 oz), SpO2 92 %.  PHYSICAL EXAMINATION:   Physical Exam  Constitutional: He is oriented to person, place, and time and well-developed, well-nourished, and in no distress. No distress.   HENT:  Head: Normocephalic.  Eyes: No scleral icterus.  Neck: Normal range of motion. Neck supple. No JVD present. No tracheal deviation present.  Cardiovascular: Normal rate, regular rhythm and normal heart sounds.  Exam reveals no gallop and no friction rub.   No murmur heard. Pulmonary/Chest: Effort normal and breath sounds normal. No respiratory distress. He has no wheezes. He has no rales. He exhibits no tenderness.  Abdominal: Soft. Bowel sounds are normal. He exhibits no distension and no mass. There is no tenderness. There is no rebound and no guarding.  Musculoskeletal: Normal range of motion. He exhibits no edema.  Neurological: He is alert and oriented to person, place, and time.  Skin: Skin is warm. No rash noted. No erythema.  Psychiatric: Affect and judgment normal.      LABORATORY PANEL:   CBC  Recent Labs Lab 06/02/15 1449  WBC 8.5  HGB 9.7*  HCT 28.9*  PLT 466*   ------------------------------------------------------------------------------------------------------------------  Chemistries   Recent Labs Lab 06/02/15 1449 06/03/15 0125  NA 132* 132*  K 4.6 4.2  CL 100* 104  CO2 21* 21*  GLUCOSE 156* 110*  BUN 33* 30*  CREATININE 1.70* 1.48*  CALCIUM 8.5* 8.1*  AST 45*  --   ALT 33  --   ALKPHOS 110  --   BILITOT 0.6  --    ------------------------------------------------------------------------------------------------------------------  Cardiac Enzymes  Recent Labs Lab 06/02/15 2045 06/03/15 0125 06/03/15 0732  TROPONINI <0.03 <0.03 <0.03   ------------------------------------------------------------------------------------------------------------------  RADIOLOGY:  Ct Head Wo Contrast  06/03/2015  CLINICAL DATA:  Headaches off and on for 2 days EXAM:  CT HEAD WITHOUT CONTRAST TECHNIQUE: Contiguous axial images were obtained from the base of the skull through the vertex without intravenous contrast. COMPARISON:  05/16/2015 FINDINGS: The  bony calvarium is intact. No gross soft tissue abnormality is noted. Diffuse atrophic changes and chronic white matter ischemic change is again identified. No findings to suggest acute hemorrhage, acute infarction or space-occupying mass lesion are seen. IMPRESSION: Chronic atrophic and ischemic changes without acute abnormality. Electronically Signed   By: Alcide CleverMark  Lukens M.D.   On: 06/03/2015 17:53   Nm Myocar Multi W/spect W/wall Motion / Ef  06/03/2015   The study is normal.  This is a low risk study.  The left ventricular ejection fraction is normal (55-65%).  There was no ST segment deviation noted during stress.    Dg Chest Port 1 View  06/02/2015  CLINICAL DATA:  Chest pain for 2 weeks EXAM: PORTABLE CHEST 1 VIEW COMPARISON:  05/16/2015 FINDINGS: Heterogeneous opacities in the left mid and lower lung zone have developed. Left pleural effusion is suspected. Right lung is clear. Normal heart size. IMPRESSION: Opacities in the left lung have developed with a suspected pleural effusion worrisome for pneumonia. Followup PA and lateral chest X-ray is recommended in 3-4 weeks following trial of antibiotic therapy to ensure resolution and exclude underlying malignancy. Electronically Signed   By: Jolaine ClickArthur  Hoss M.D.   On: 06/02/2015 15:00     ASSESSMENT AND PLAN:   [age] year old male with a history of essential hypertension and diabetes who presented with atypical chest pain. For further tilt please refer the H&P.   1. Atypical chest pain: Troponins 3 are negative. Telemetry showed no abnormal heart rhythm. Patient is to undergo cardiac stress test.  2. Chronic kidney disease stage III: Creatinine is at baseline.  3. Essential hypertension: Patient will continue Norvasc, Coreg and quinapril.  4. Diabetes: Diabetes coordinator recommended decreasing Lantus 7 units at bedtime. Patient will continue on ADA diet.  5. Hypothyroidism: Continue Synthroid.  6. Hyperlipidemia: Continue Mevacor  7.  Pneumonia: Patient was started on Levaquin and will complete course of treatment for 6 days.   8. Confusion: Daughter says that he has confusion at times, sounds like he may be at his baseline. NO focal neurological symptoms on exam today. Check TSH and B12 levels.      Management plans discussed with the patient and he is in agreement.  CODE STATUS: full  TOTAL TIME TAKING CARE OF THIS PATIENT: 30 minutes.     POSSIBLE D/C 1-2 days, DEPENDING ON CLINICAL CONDITION.   Blanca Thornton M.D on 06/03/2015 at 8:10 PM  Between 7am to 6pm - Pager - [phone] After 6pm go to [email or web address] - password [email or web address] [facility]  [facility]  Office  [phone]  CC: Primary care physician; Marisue IvanLINTHAVONG, KANHKA, MD  Note: This dictation was prepared with Dragon dictation along with smaller phrase technology. Any transcriptional errors that result from this process are unintentional.

## 2015-06-03 NOTE — Care Management Obs Status (Signed)
MEDICARE OBSERVATION STATUS NOTIFICATION   Patient Details  Name: Juan Walker MRN: [id number] Date of Birth: 07/30/1931   Medicare Observation Status Notification Given:  Yes  patient confused.  Signed by Dorise Hissgrandson    Kinzie Wickes, Roseanne KaufmanNann R, RN 06/03/2015, 6:27 PM

## 2015-06-03 NOTE — Discharge Summary (Addendum)
[facility] - [place] at [facility]   PATIENT NAME: Juan FlurryJerry Walker    MR#:  [id number]  DATE OF BIRTH:  Sep 30, 1931  DATE OF ADMISSION:  06/02/2015 ADMITTING PHYSICIAN: Shaune PollackQing Chen, MD  DATE OF DISCHARGE: 06/04/2015  PRIMARY CARE PHYSICIAN: Marisue IvanLINTHAVONG, KANHKA, MD    ADMISSION DIAGNOSIS:  Chest pain, unspecified chest pain type [R07.9]  DISCHARGE DIAGNOSIS:  Principal Problem:   Chest pain   SECONDARY DIAGNOSIS:   Past Medical History  Diagnosis Date  . Essential (primary) hypertension 02/16/2015  . Diverticular disease of large intestine 02/16/2015  . Acquired hypothyroidism 02/16/2015  . Diabetes mellitus (HCC) 02/16/2015    Overview:  insulin 2005 last eye exam 01/2013 [facility]   . Depression   . Glaucoma   . Gout   . Renal disorder   . BPH (benign prostatic hyperplasia)   . BPH (benign prostatic hypertrophy)   . Cataract     Right eye  . Anemia     chronic  . Diverticulosis of colon without hemorrhage   . Gout   . History of prostatitis   . Morbid obesity (HCC)   . Psoriasis   . Shingles   . Renal insufficiency     HOSPITAL COURSE:   82 year old male with a history of essential hypertension and diabetes who presented with atypical chest pain.  1. Atypical chest pain: Troponins 3 are negative. Telemetry showed no abnormal heart rhythm. Cardiac stress test shows low risk for ischemia.  2. Chronic kidney disease stage III: Creatinine is at baseline.  3. Essential hypertension: Patient will continue Norvasc, Coreg and quinapril.  4. Diabetes: Diabetes coordinator recommended decreasing Lantus 7 units at bedtime. Patient will continue on ADA diet.  5. Hypothyroidism: Continue Synthroid.  6. Hyperlipidemia: Continue Mevacor  7. Community-acquired Pneumonia: Patient was started on Levaquin and will complete course of treatment for 6 days.   8. Confusion: Head CT was negative for acute ischemia. Patient seems to be at his  baseline. I am suspecting confusion was related to community-acquired pneumonia. TSH is within normal limits.   DISCHARGE CONDITIONS AND DIET:  Stable for discharge  CONSULTS OBTAINED:     DRUG ALLERGIES:   Allergies  Allergen Reactions  . Aspirin Nausea And Vomiting and Other (See Comments)    Reaction:  Constipation  . Metformin Nausea And Vomiting and Other (See Comments)    Pt states that this medication makes his blood clot.    . Pioglitazone Other (See Comments)    Reaction:  Left-sided weakness  . Rosiglitazone Other (See Comments)    Reaction:  Left-sided weakness    DISCHARGE MEDICATIONS:   Current Discharge Medication List    START taking these medications   Details  levofloxacin (LEVAQUIN) 750 MG tablet Take 1 tablet (750 mg total) by mouth every other day. Qty: 4 tablet, Refills: 0      CONTINUE these medications which have CHANGED   Details  insulin glargine (LANTUS) 100 UNIT/ML injection Inject 0.07 mLs (7 Units total) into the skin at bedtime. Qty: 10 mL, Refills: 11      CONTINUE these medications which have NOT CHANGED   Details  allopurinol (ZYLOPRIM) 300 MG tablet Take 300 mg by mouth daily.     Alum & Mag Hydroxide-Simeth (GI COCKTAIL) SUSP suspension Take 5 mLs by mouth every 4 (four) hours as needed for indigestion.     amLODipine (NORVASC) 5 MG tablet Take 5 mg by mouth daily.  b complex vitamins tablet Take 1 tablet by mouth daily.    carvedilol (COREG) 3.125 MG tablet Take 1 tablet (3.125 mg total) by mouth 2 (two) times daily with a meal. Qty: 60 tablet, Refills: 0    docusate sodium (COLACE) 100 MG capsule Take 100 mg by mouth 2 (two) times daily.    insulin aspart protamine- aspart (NOVOLOG MIX 70/30) (70-30) 100 UNIT/ML injection Inject 10-12 Units into the skin 2 (two) times daily with a meal.     lactulose (CHRONULAC) 10 GM/15ML solution Take 20 g by mouth daily as needed for mild constipation.     levothyroxine (SYNTHROID,  LEVOTHROID) 50 MCG tablet Take 50 mcg by mouth daily before breakfast.     lovastatin (MEVACOR) 20 MG tablet Take 20 mg by mouth at bedtime.     oxyCODONE-acetaminophen (PERCOCET/ROXICET) 5-325 MG tablet Take 1 tablet by mouth every 4 (four) hours as needed for moderate pain. Qty: 40 tablet, Refills: 0    pantoprazole (PROTONIX) 40 MG tablet Take 40 mg by mouth 2 (two) times daily.     polyethylene glycol (MIRALAX / GLYCOLAX) packet Take 17 g by mouth 2 (two) times daily. Qty: 14 each, Refills: 0    psyllium (REGULOID) 0.52 g capsule Take 0.52 g by mouth daily.    quinapril (ACCUPRIL) 20 MG tablet Take 20 mg by mouth daily.     saccharomyces boulardii (FLORASTOR) 250 MG capsule Take 250 mg by mouth 2 (two) times daily.    senna-docusate (SENOKOT-S) 8.6-50 MG tablet Take 2 tablets by mouth daily.    sucralfate (CARAFATE) 1 g tablet Take 1 g by mouth 4 (four) times daily -  with meals and at bedtime.    tamsulosin (FLOMAX) 0.4 MG CAPS capsule Take 0.4 mg by mouth daily after breakfast.     diazepam (VALIUM) 5 MG tablet Take 1 tablet (5 mg total) by mouth every 6 (six) hours as needed for anxiety or muscle spasms. Qty: 30 tablet, Refills: 0    ondansetron (ZOFRAN) 4 MG tablet Take 1 tablet (4 mg total) by mouth daily as needed for nausea or vomiting. Qty: 10 tablet, Refills: 0    traMADol (ULTRAM) 50 MG tablet Take 1-2 tablets (50-100 mg total) by mouth every 6 (six) hours as needed for moderate pain. Qty: 50 tablet, Refills: 0              Today   CHIEF COMPLAINT:    Patient is doing well today. Patient does not appear to be confused at all. Patient refuses skilled nursing facility.  VITAL SIGNS:  Blood pressure 141/59, pulse 79, temperature 98.2 F (36.8 C), temperature source Oral, resp. rate 18, height 5\' 5"  (1.651 m), weight 95.89 kg (211 lb 6.4 oz), SpO2 94 %.   REVIEW OF SYSTEMS:  Review of Systems  Constitutional: Negative for fever, chills and  malaise/fatigue.  HENT: Negative for ear discharge, ear pain, hearing loss, nosebleeds and sore throat.   Eyes: Negative for blurred vision and pain.  Respiratory: Negative for cough, hemoptysis, shortness of breath and wheezing.   Cardiovascular: Negative for chest pain, palpitations and leg swelling.  Gastrointestinal: Negative for nausea, vomiting, abdominal pain, diarrhea and blood in stool.  Genitourinary: Negative for dysuria.  Musculoskeletal: Negative for back pain.  Neurological: Negative for dizziness, tremors, speech change, focal weakness, seizures and headaches.  Endo/Heme/Allergies: Does not bruise/bleed easily.  Psychiatric/Behavioral: Positive for memory loss. Negative for depression, suicidal ideas and hallucinations.     PHYSICAL EXAMINATION:  GENERAL:  82 y.o.-year-old patient sitting in chair  with no acute distress.  NECK:  Supple, no jugular venous distention. No thyroid enlargement, no tenderness.  LUNGS: Normal breath sounds bilaterally, no wheezing, rales,rhonchi  No use of accessory muscles of respiration.  CARDIOVASCULAR: S1, S2 normal. No murmurs, rubs, or gallops.  ABDOMEN: Soft, non-tender, non-distended. Bowel sounds present. No organomegaly or mass.  EXTREMITIES: No pedal edema, cyanosis, or clubbing.  PSYCHIATRIC: The patient is alert and oriented x 3.  SKIN: No obvious rash, lesion, or ulcer.   DATA REVIEW:   CBC  Recent Labs Lab 06/02/15 1449  WBC 8.5  HGB 9.7*  HCT 28.9*  PLT 466*    Chemistries   Recent Labs Lab 06/02/15 1449  06/04/15 0540  NA 132*  < > 134*  K 4.6  < > 4.1  CL 100*  < > 105  CO2 21*  < > 21*  GLUCOSE 156*  < > 129*  BUN 33*  < > 32*  CREATININE 1.70*  < > 1.44*  CALCIUM 8.5*  < > 8.1*  AST 45*  --   --   ALT 33  --   --   ALKPHOS 110  --   --   BILITOT 0.6  --   --   < > = values in this interval not displayed.  Cardiac Enzymes  Recent Labs Lab 06/02/15 2045 06/03/15 0125 06/03/15 0732  TROPONINI  <0.03 <0.03 <0.03    Microbiology Results  @  RADIOLOGY:  Ct Head Wo Contrast  06/03/2015  CLINICAL DATA:  Headaches off and on for 2 days EXAM: CT HEAD WITHOUT CONTRAST TECHNIQUE: Contiguous axial images were obtained from the base of the skull through the vertex without intravenous contrast. COMPARISON:  05/16/2015 FINDINGS: The bony calvarium is intact. No gross soft tissue abnormality is noted. Diffuse atrophic changes and chronic white matter ischemic change is again identified. No findings to suggest acute hemorrhage, acute infarction or space-occupying mass lesion are seen. IMPRESSION: Chronic atrophic and ischemic changes without acute abnormality. Electronically Signed   By: Alcide Clever M.D.   On: 06/03/2015 17:53   Nm Myocar Multi W/spect W/wall Motion / Ef  06/03/2015   The study is normal.  This is a low risk study.  The left ventricular ejection fraction is normal (55-65%).  There was no ST segment deviation noted during stress.    Dg Chest Port 1 View  06/02/2015  CLINICAL DATA:  Chest pain for 2 weeks EXAM: PORTABLE CHEST 1 VIEW COMPARISON:  05/16/2015 FINDINGS: Heterogeneous opacities in the left mid and lower lung zone have developed. Left pleural effusion is suspected. Right lung is clear. Normal heart size. IMPRESSION: Opacities in the left lung have developed with a suspected pleural effusion worrisome for pneumonia. Followup PA and lateral chest X-ray is recommended in 3-4 weeks following trial of antibiotic therapy to ensure resolution and exclude underlying malignancy. Electronically Signed   By: Jolaine Click M.D.   On: 06/02/2015 15:00      Management plans discussed with the patient and he is in agreement. Stable for discharge home Patient's daughter wanted patient to go to skilled nursing facility however patient refused. Physical therapy is recommended home with home health care.  Patient should follow up with PCP in 1 week  CODE STATUS:     Code  Status Orders        Start     Ordered   06/02/15 1837  Full code  Continuous     06/02/15 1836    Code Status History    Date Active Date Inactive Code Status Order ID Comments User Context   05/16/2015  5:32 PM 05/21/2015  7:49 PM Full Code [id number]  Marguarite Arbour, MD Inpatient    Advance Directive Documentation        Most Recent Value   Type of Advance Directive  Living will   Pre-existing out of facility DNR order (yellow form or pink MOST form)     "MOST" Form in Place?        TOTAL TIME TAKING CARE OF THIS PATIENT: 35 minutes.    Note: This dictation was prepared with Dragon dictation along with smaller phrase technology. Any transcriptional errors that result from this process are unintentional.  Jaydan Chretien M.D on 06/04/2015 at 12:22 PM  Between 7am to 6pm - Pager - [phone] After 6pm go to [email or web address] - password [email or web address] [facility]  [place] [facility]  Office  [phone]  CC: Primary care physician; Marisue Ivan, MD

## 2015-06-04 LAB — BASIC METABOLIC PANEL
Anion gap: 8 (ref 5–15)
BUN: 32 mg/dL — ABNORMAL HIGH (ref 6–20)
CHLORIDE: 105 mmol/L (ref 101–111)
CO2: 21 mmol/L — ABNORMAL LOW (ref 22–32)
CREATININE: 1.44 mg/dL — AB (ref 0.61–1.24)
Calcium: 8.1 mg/dL — ABNORMAL LOW (ref 8.9–10.3)
GFR, EST AFRICAN AMERICAN: 51 mL/min — AB (ref >60)
GFR, EST NON AFRICAN AMERICAN: 44 mL/min — AB (ref >60)
Glucose, Bld: 129 mg/dL — ABNORMAL HIGH (ref 65–99)
Potassium: 4.1 mmol/L (ref 3.5–5.1)
SODIUM: 134 mmol/L — AB (ref 135–145)

## 2015-06-04 LAB — VITAMIN B12: VITAMIN B 12: 538 pg/mL (ref 180–914)

## 2015-06-04 LAB — GLUCOSE, CAPILLARY
GLUCOSE-CAPILLARY: 127 mg/dL — AB (ref 65–99)
Glucose-Capillary: 163 mg/dL — ABNORMAL HIGH (ref 65–99)

## 2015-06-04 LAB — TSH: TSH: 4.064 u[IU]/mL (ref 0.350–4.500)

## 2015-06-04 MED ORDER — LEVOFLOXACIN 750 MG PO TABS: 750.0000 mg | ORAL_TABLET | ORAL | Status: AC

## 2015-06-04 MED ORDER — LEVOFLOXACIN 750 MG PO TABS
750.0000 mg | ORAL_TABLET | ORAL | Status: DC
  Administered 2015-06-04: 750 mg via ORAL
  Filled 2015-06-04: qty 1

## 2015-06-04 NOTE — Clinical Social Work Note (Signed)
Clinical Social Work Assessment  Patient Details  Name: Juan Walker MRN: [id number] Date of Birth: May 23, 1931  Date of referral:  06/04/15               Reason for consult:   (Daughter called requesting placement)                Permission sought to share information with:  Family Supports Permission granted to share information::  Yes, Verbal Permission Granted  Name::        Agency::     Relationship::     Contact Information:     Housing/Transportation Living arrangements for the past 2 months:  Single Family Home Source of Information:  Patient Patient Interpreter Needed:  None Criminal Activity/Legal Involvement Pertinent to Current Situation/Hospitalization:  No - Comment as needed Significant Relationships:  Adult Children Lives with:    Do you feel safe going back to the place where you live?  Yes Need for family participation in patient care:  No (Coment)  Care giving concerns:  Patient resides at home and his daughter resides over the garage.   Social Worker assessment / plan:  CSW informed by RN CM that patient's daughter: Jenny Reichmann: [phone] called her and stated that she wanted her dad to go to rehab at discharge and that she was willing to pay out of pocket for it. RN CM explained the qualifications for rehab and that PT was pending and stated to her that patient would have to be in agreement. CSW met with patient this morning and explained what his daughter was requesting and discussing about his safety at home. PT also had evaluated patient this morning and recommended home health. Patient states that he is not wanting to go to rehab and that he will return home. Patient states he respects his daughter's request but he stated that she has been trying to control things for a while now and that he is not ready to let her do that just yet. He stated that his daughter lives over his garage and that she currently drives his car that he intends to eventually give to her. Patient  stated that he realizes that he is not in perfect health condition and that he wouldn't expect to be at almost 82 years of age. He stated that I could call his daughter and inform her of his decision. CSW has contacted Jenny Reichmann and informed her of this information. She said, "Ok, thank you."   Employment status:  Retired Nurse, adult PT Recommendations:  Home with [place] / Referral to community resources:     Patient/Family's Response to care:  Daughter not happy that her father would not agree to rehab. Patient has chosen to return home.  Patient/Family's Understanding of and Emotional Response to Diagnosis, Current Treatment, and Prognosis:  Patient believes he can manage at home. RN CM to arrange home health.  Emotional Assessment Appearance:  Appears stated age Attitude/Demeanor/Rapport:   (pleasant and cooperative) Affect (typically observed):  Calm, Appropriate Orientation:  Oriented to Self, Oriented to Place, Oriented to  Time, Oriented to Situation Alcohol / Substance use:  Not Applicable Psych involvement (Current and /or in the community):  No (Comment)  Discharge Needs  Concerns to be addressed:  Care Coordination Readmission within the last 30 days:  No Current discharge risk:  None Barriers to Discharge:  No Barriers Identified   Shela Leff, LCSW 06/04/2015, 11:52 AM

## 2015-06-04 NOTE — Progress Notes (Signed)
06/04/2015   BP 141/59 mmHg  Pulse 79  Temp(Src) 98.2 F (36.8 C) (Oral)  Resp 18  Ht 5\' 5"  (1.651 m)  Wt 95.89 kg (211 lb 6.4 oz)  BMI 35.18 kg/m2  SpO2 94%. Patient discharged home with home with home health per MD orders. Discharge instructions reviewed with patient and daughter and  verbalized understanding. IV removed per policy. Prescriptions discussed with patient and daughter. Discharged via wheelchair escorted by nursing staff and volunteers.  Ron ParkerHerron, Caylan Schifano D, RN

## 2015-06-04 NOTE — Progress Notes (Signed)
[facility] - [place] at [facility]   PATIENT NAME: Juan Walker    MR#:  [id number]  DATE OF BIRTH:  27-Jan-1932  SUBJECTIVE:   Patient doing well this point. He does not appear to be confused.   REVIEW OF SYSTEMS:    Review of Systems  Constitutional: Negative for fever, chills and malaise/fatigue.  HENT: Negative for ear discharge, ear pain, hearing loss, nosebleeds and sore throat.   Eyes: Negative for blurred vision and pain.  Respiratory: Negative for cough, hemoptysis, shortness of breath and wheezing.   Cardiovascular: Negative for chest pain, palpitations and leg swelling.  Gastrointestinal: Negative for nausea, vomiting, abdominal pain, diarrhea and blood in stool.  Genitourinary: Negative for dysuria.  Musculoskeletal: Negative for back pain.  Neurological: Negative for dizziness, tremors, speech change, focal weakness, seizures and headaches.  Endo/Heme/Allergies: Does not bruise/bleed easily.  Psychiatric/Behavioral: Positive for memory loss. Negative for depression, suicidal ideas and hallucinations.    Tolerating Diet:yes      DRUG ALLERGIES:   Allergies  Allergen Reactions  . Aspirin Nausea And Vomiting and Other (See Comments)    Reaction:  Constipation  . Metformin Nausea And Vomiting and Other (See Comments)    Pt states that this medication makes his blood clot.    . Pioglitazone Other (See Comments)    Reaction:  Left-sided weakness  . Rosiglitazone Other (See Comments)    Reaction:  Left-sided weakness    VITALS:  Blood pressure 141/59, pulse 79, temperature 98.2 F (36.8 C), temperature source Oral, resp. rate 18, height  (1.651 m), weight 95.89 kg (211 lb 6.4 oz), SpO2 94 %.  PHYSICAL EXAMINATION:   Physical Exam  Constitutional: He is oriented to person, place, and time and well-developed, well-nourished, and in no distress. No distress.  HENT:  Head: Normocephalic.  Eyes: No scleral icterus.  Neck:  Normal range of motion. Neck supple. No JVD present. No tracheal deviation present.  Cardiovascular: Normal rate, regular rhythm and normal heart sounds.  Exam reveals no gallop and no friction rub.   No murmur heard. Pulmonary/Chest: Effort normal and breath sounds normal. No respiratory distress. He has no wheezes. He has no rales. He exhibits no tenderness.  Abdominal: Soft. Bowel sounds are normal. He exhibits no distension and no mass. There is no tenderness. There is no rebound and no guarding.  Musculoskeletal: Normal range of motion. He exhibits no edema.  Neurological: He is alert and oriented to person, place, and time.  Skin: Skin is warm. No rash noted. No erythema.  Psychiatric: Affect and judgment normal.      LABORATORY PANEL:   CBC  Recent Labs Lab 06/02/15 1449  WBC 8.5  HGB 9.7*  HCT 28.9*  PLT 466*   ------------------------------------------------------------------------------------------------------------------  Chemistries   Recent Labs Lab 06/02/15 1449  06/04/15 0540  NA 132*  < > 134*  K 4.6  < > 4.1  CL 100*  < > 105  CO2 21*  < > 21*  GLUCOSE 156*  < > 129*  BUN 33*  < > 32*  CREATININE 1.70*  < > 1.44*  CALCIUM 8.5*  < > 8.1*  AST 45*  --   --   ALT 33  --   --   ALKPHOS 110  --   --   BILITOT 0.6  --   --   < > = values in this interval not displayed. ------------------------------------------------------------------------------------------------------------------  Cardiac Enzymes  Recent Labs Lab 06/02/15 2045 06/03/15  0125 06/03/15 0732  TROPONINI <0.03 <0.03 <0.03   ------------------------------------------------------------------------------------------------------------------  RADIOLOGY:  Ct Head Wo Contrast  06/03/2015  CLINICAL DATA:  Headaches off and on for 2 days EXAM: CT HEAD WITHOUT CONTRAST TECHNIQUE: Contiguous axial images were obtained from the base of the skull through the vertex without intravenous contrast.  COMPARISON:  05/16/2015 FINDINGS: The bony calvarium is intact. No gross soft tissue abnormality is noted. Diffuse atrophic changes and chronic white matter ischemic change is again identified. No findings to suggest acute hemorrhage, acute infarction or space-occupying mass lesion are seen. IMPRESSION: Chronic atrophic and ischemic changes without acute abnormality. Electronically Signed   By: Alcide CleverMark  Lukens M.D.   On: 06/03/2015 17:53   Nm Myocar Multi W/spect W/wall Motion / Ef  06/03/2015   The study is normal.  This is a low risk study.  The left ventricular ejection fraction is normal (55-65%).  There was no ST segment deviation noted during stress.    Dg Chest Port 1 View  06/02/2015  CLINICAL DATA:  Chest pain for 2 weeks EXAM: PORTABLE CHEST 1 VIEW COMPARISON:  05/16/2015 FINDINGS: Heterogeneous opacities in the left mid and lower lung zone have developed. Left pleural effusion is suspected. Right lung is clear. Normal heart size. IMPRESSION: Opacities in the left lung have developed with a suspected pleural effusion worrisome for pneumonia. Followup PA and lateral chest X-ray is recommended in 3-4 weeks following trial of antibiotic therapy to ensure resolution and exclude underlying malignancy. Electronically Signed   By: Jolaine ClickArthur  Hoss M.D.   On: 06/02/2015 15:00     ASSESSMENT AND PLAN:   82 year old male with a history of essential hypertension and diabetes who presented with atypical chest pain.  1. Atypical chest pain: Troponins 3 are negative. Telemetry showed no abnormal heart rhythm. Cardiac stress test shows low risk for ischemia.   2. Chronic kidney disease stage III: Creatinine is at baseline.  3. Essential hypertension: Patient will continue Norvasc, Coreg and quinapril.  4. Diabetes: Diabetes coordinator recommended decreasing Lantus 7 units at bedtime. Patient will continue on ADA diet.  5. Hypothyroidism: Continue Synthroid.  6. Hyperlipidemia: Continue  Mevacor  7. Community-acquired Pneumonia: Patient was started on Levaquin and will complete course of treatment for 6 days.   8. Confusion: Head CT was negative for acute ischemia. Patient seems to be at his baseline. I am suspecting confusion was related to community-acquired pneumonia. TSH is within normal limits.    Management plans discussed with the patient and he is in agreement.  CODE STATUS: full  TOTAL TIME TAKING CARE OF THIS PATIENT: 28  minutes.  PT recs HHPT patient refuses SNF as his daughter wishes   POSSIBLE D/C today, DEPENDING ON CLINICAL CONDITION.   Chantille Navarrete M.D on 06/04/2015 at 11:35 AM  Between 7am to 6pm - Pager - [phone] After 6pm go to [email or web address] - password [email or web address] [facility]  [facility]  Office  [phone]  CC: Primary care physician; Marisue IvanLINTHAVONG, KANHKA, MD  Note: This dictation was prepared with Dragon dictation along with smaller phrase technology. Any transcriptional errors that result from this process are unintentional.

## 2015-06-04 NOTE — Care Management (Signed)
Received call from daughter with concerns of patient returning to the home.  Daughter Arline AspCindy states that patient has history of multiple falls and is unable to care for himself.  Arline AspCindy states that the patient is alone between the hours of 6am-6pm.  I suggest that PCS services be added in the home.  Arline AspCindy states "this  Option will not work".  MD has order PT consult.  Arline AspCindy states that if patient does not qualify for SNF placement she would like to speak with CSW about paying out of pocket for placement.

## 2015-06-04 NOTE — Evaluation (Signed)
Physical Therapy Evaluation Patient Details Name: Juan Walker MRN: [id number] DOB: [birth year] Today's Date: 06/04/2015   History of Present Illness  Patient is an 82 y/o male that presents to the hospital with complaints of chest pain. Work up thus far has been negative. Recently admitted for recurrent syncope, weakness, N/V.   Clinical Impression  Patient recently seen by PTs at this facility after experiencing syncope/weakness. Patient appears to be at his most recent baseline physically, he is able to transfer sit to stand and ambulate with RW without evidence of loss of balance, chest pain, or dyspnea. Patient does appear to be confused in this session as he knows where he is, his birthdate, however he does not recall why he presented to [facility]. He rambles in session on topics not pertaining to PT questioning, appears to be a relatively poor historian. Would recommend supervision with HHPT as he is now using a RW, while he was independent recently. Underlying cause of confusion unclear, it appears he was driving recently.     Follow Up Recommendations Home health PT Supervision while ambulating    Equipment Recommendations  Rolling walker with 5" wheels    Recommendations for Other Services       Precautions / Restrictions Precautions Precautions: Fall Restrictions Weight Bearing Restrictions: No      Mobility  Bed Mobility               General bed mobility comments: Patient received sitting at EOB  Transfers Overall transfer level: Needs assistance Equipment used: Rolling walker (2 wheeled) Transfers: Sit to/from Stand Sit to Stand: Supervision         General transfer comment: Trunk flexed throughout transfer, no loss of balance without AD, he opted to ambulate with AD in this session.  Ambulation/Gait Ambulation/Gait assistance: Supervision Ambulation Distance (Feet): 160 Feet Assistive device: Rolling walker (2 wheeled) Gait Pattern/deviations:  Step-through pattern   Gait velocity interpretation: Below normal speed for age/gender General Gait Details: Patient remains flexed throughout trunk and cervical region during ambulation. No dyspnea on exertion, no signs of buckling or other loss of balance noted. Patient offered to ambulate without device, he declined and preferred RW as he had been using recently at home.   Stairs            Wheelchair Mobility    Modified Rankin (Stroke Patients Only)       Balance Overall balance assessment: Needs assistance Sitting-balance support: No upper extremity supported Sitting balance-Leahy Scale: Good     Standing balance support: Bilateral upper extremity supported Standing balance-Leahy Scale: Fair Standing balance comment: Modified DGI (9/12 with RW).                              Pertinent Vitals/Pain Pain Assessment:  (Patient asked about pain and begins conversation about things he needs to do at the moment, no demonstration of pain during session)    Home Living Family/patient expects to be discharged to:: Private residence Living Arrangements: Children Available Help at Discharge: Family (Daughter lives on 2nd floor and works full time)   Home Access: Stairs to enter Entrance Stairs-Rails: Right;Left;Can reach both Secretary/administratorntrance Stairs-Number of Steps: 4 Home Layout: Two level;Able to live on main level with bedroom/bathroom (Patient lives on 1st floor.) Home Equipment: Dan HumphreysWalker - 2 wheels;Wheelchair - manual      Prior Function Level of Independence: Independent with assistive device(s)  Comments: Per recent notes patient had been driving recently. Since last admission he reports using RW at home, poor historian.      Hand Dominance        Extremity/Trunk Assessment   Upper Extremity Assessment: Overall WFL for tasks assessed (Able to squeeze PT fingers and touch the top of his head bilaterally)           Lower Extremity Assessment:  Overall WFL for tasks assessed (Able to hold knee extensors/hip flexors. Unable to test clonus as patient cannot follow orders well)         Communication   Communication: No difficulties  Cognition Arousal/Alertness: Awake/alert Behavior During Therapy: WFL for tasks assessed/performed;Anxious Overall Cognitive Status: Impaired/Different from baseline Area of Impairment:  (Unclear what baseline is he is oriented to self, place, but not why he came to the hospital. He is very vague with answers, unable to provide much insight as to what his physical history is.)                    General Comments      Exercises        Assessment/Plan    PT Assessment Patient needs continued PT services  PT Diagnosis Difficulty walking;Generalized weakness   PT Problem List Decreased strength;Decreased activity tolerance;Decreased balance;Decreased mobility  PT Treatment Interventions DME instruction;Gait training;Stair training;Functional mobility training;Therapeutic activities;Therapeutic exercise;Balance training;Patient/family education   PT Goals (Current goals can be found in the Care Plan section) Acute Rehab PT Goals Patient Stated Goal: to go home PT Goal Formulation: With patient Time For Goal Achievement: 06/27/2015 Potential to Achieve Goals: Good    Frequency Min 2X/week   Barriers to discharge Decreased caregiver support Family is concerned that patient lives alone during the day    Co-evaluation               End of Session Equipment Utilized During Treatment: Gait belt Activity Tolerance: Patient tolerated treatment well Patient left: in bed;with call bell/phone within reach;with bed alarm set Nurse Communication: Mobility status    Functional Assessment Tool Used: Modified DGI Functional Limitation: Mobility: Walking and moving around Mobility: Walking and Moving Around Current Status [phone]): At least 1 percent but less than 20 percent impaired, limited  or restricted Mobility: Walking and Moving Around Goal Status [phone]): At least 1 percent but less than 20 percent impaired, limited or restricted    Time: 1012-1025 PT Time Calculation (min) (ACUTE ONLY): 13 min   Charges:   PT Evaluation $PT Eval Moderate Complexity: 1 Procedure     PT G Codes:   PT G-Codes **NOT FOR INPATIENT CLASS** Functional Assessment Tool Used: Modified DGI Functional Limitation: Mobility: Walking and moving around Mobility: Walking and Moving Around Current Status ([id number]): At least 1 percent but less than 20 percent impaired, limited or restricted Mobility: Walking and Moving Around Goal Status [phone]): At least 1 percent but less than 20 percent impaired, limited or restricted   Kerin Ransom, PT, DPT    06/04/2015, 11:16 AM

## 2015-06-04 NOTE — Care Management (Signed)
CSW has spoken with daughter and patient.  Patient was alert and oriented.  Patient has declined home health.  Patient was already open with home health through [facility].  Resumption of care orders was placed.  I have notified Barbara CowerJason with [facility] notified of discharge plan.  Patient states that he already has RW in the home.  RNCM signing off.

## 2015-06-05 ENCOUNTER — Emergency Department
Admission: EM | Admit: 2015-06-05 | Discharge: 2015-06-05 | Disposition: A | Discharge status: Skilled Nursing Facility | Payer: PPO | Attending: Emergency Medicine | Admitting: Emergency Medicine

## 2015-06-05 ENCOUNTER — Encounter: Payer: Self-pay | Admitting: *Deleted

## 2015-06-05 DIAGNOSIS — Z87891 Personal history of nicotine dependence: Secondary | ICD-10-CM | POA: Diagnosis not present

## 2015-06-05 DIAGNOSIS — R531 Weakness: Secondary | ICD-10-CM | POA: Insufficient documentation

## 2015-06-05 DIAGNOSIS — I10 Essential (primary) hypertension: Secondary | ICD-10-CM | POA: Insufficient documentation

## 2015-06-05 DIAGNOSIS — E119 Type 2 diabetes mellitus without complications: Secondary | ICD-10-CM | POA: Insufficient documentation

## 2015-06-05 DIAGNOSIS — F329 Major depressive disorder, single episode, unspecified: Secondary | ICD-10-CM | POA: Diagnosis not present

## 2015-06-05 DIAGNOSIS — E039 Hypothyroidism, unspecified: Secondary | ICD-10-CM | POA: Diagnosis not present

## 2015-06-05 DIAGNOSIS — Z888 Allergy status to other drugs, medicaments and biological substances status: Secondary | ICD-10-CM | POA: Diagnosis not present

## 2015-06-05 DIAGNOSIS — Z79899 Other long term (current) drug therapy: Secondary | ICD-10-CM | POA: Diagnosis not present

## 2015-06-05 LAB — BASIC METABOLIC PANEL
Anion gap: 9 (ref 5–15)
BUN: 35 mg/dL — AB (ref 4–21)
BUN: 35 mg/dL — AB (ref 6–20)
CALCIUM: 8.5 mg/dL — AB (ref 8.9–10.3)
CO2: 20 mmol/L — ABNORMAL LOW (ref 22–32)
CREATININE: 1.71 mg/dL — AB (ref 0.61–1.24)
Chloride: 104 mmol/L (ref 101–111)
Creatinine: 1.7 mg/dL — AB (ref 0.6–1.3)
GFR calc Af Amer: 41 mL/min — ABNORMAL LOW (ref >60)
GFR, EST NON AFRICAN AMERICAN: 36 mL/min — AB (ref >60)
GLUCOSE: 163 mg/dL — AB (ref 65–99)
Glucose: 163 mg/dL
POTASSIUM: 4.2 mmol/L (ref 3.5–5.1)
SODIUM: 133 mmol/L — AB (ref 135–145)
Sodium: 133 mmol/L — AB (ref 137–147)

## 2015-06-05 LAB — CBC
HEMATOCRIT: 27.6 % — AB (ref 40.0–52.0)
Hemoglobin: 9.2 g/dL — ABNORMAL LOW (ref 13.0–18.0)
MCH: 29.9 pg (ref 26.0–34.0)
MCHC: 33.3 g/dL (ref 32.0–36.0)
MCV: 89.8 fL (ref 80.0–100.0)
PLATELETS: 508 10*3/uL — AB (ref 150–440)
RBC: 3.07 MIL/uL — ABNORMAL LOW (ref 4.40–5.90)
RDW: 13.7 % (ref 11.5–14.5)
WBC: 9.4 10*3/uL (ref 3.8–10.6)

## 2015-06-05 LAB — CBC AND DIFFERENTIAL: WBC: 9.4 10*3/mL

## 2015-06-05 LAB — TROPONIN I

## 2015-06-05 MED ORDER — ACETAMINOPHEN 325 MG PO TABS
650.0000 mg | ORAL_TABLET | Freq: Once | ORAL | Status: AC
  Administered 2015-06-05: 650 mg via ORAL
  Filled 2015-06-05: qty 2

## 2015-06-05 NOTE — ED Provider Notes (Signed)
Time Seen: Approximately 0 940  I have reviewed the triage notes  Chief Complaint: Weakness   History of Present Illness: Juan Walker is a 82 y.o. male who was just recently discharged from the hospital for evaluation for chest discomfort. Patient was offered rehabilitation therapy but declined at that point. The daughter who lives with the patient home is having trouble taking care of him due to generalized weakness. They contacted their primary." Dr. Marland Kitchen Patient was referred back to the emergency department for social services consult for rehabilitation therapy. The patient has no physical complaints at this time outside of generalized weakness. He denies any focal weakness. He denies any headaches, chest pain, abdominal pain, etc.   Past Medical History  Diagnosis Date  . Essential (primary) hypertension 02/16/2015  . Diverticular disease of large intestine 02/16/2015  . Acquired hypothyroidism 02/16/2015  . Diabetes mellitus (HCC) 02/16/2015    Overview:  insulin 2005 last eye exam 01/2013 [facility]   . Depression   . Glaucoma   . Gout   . Renal disorder   . BPH (benign prostatic hyperplasia)   . BPH (benign prostatic hypertrophy)   . Cataract     Right eye  . Anemia     chronic  . Diverticulosis of colon without hemorrhage   . Gout   . History of prostatitis   . Morbid obesity (HCC)   . Psoriasis   . Shingles   . Renal insufficiency     Patient Active Problem List   Diagnosis Date Noted  . Chest pain 06/02/2015  . Syncope and collapse 05/16/2015  . SVT (supraventricular tachycardia) (HCC) 05/16/2015  . Hyponatremia 05/16/2015  . Diarrhea 05/16/2015  . Notalgia 03/29/2015  . Renal cyst, left 03/29/2015  . Left sided abdominal pain 03/29/2015  . Acquired hypothyroidism 02/16/2015  . Benign fibroma of prostate 02/16/2015  . Cataract 02/16/2015  . Chronic anemia 02/16/2015  . Diverticular disease of large intestine 02/16/2015  . Essential (primary)  hypertension 02/16/2015  . Gout 02/16/2015  . Morbid obesity (HCC) 02/16/2015  . Psoriasis 02/16/2015  . Diabetes mellitus (HCC) 02/16/2015  . Microhematuria 02/16/2015  . Renal cyst 02/16/2015  . Type 2 diabetes mellitus (HCC) 08/06/2014    Past Surgical History  Procedure Laterality Date  . Nephrolithotomy  1972  . Sinus irrigation    . Excision benign skin lesion ears    . Kidney stones removed    . Esophagogastroduodenoscopy (egd) with propofol N/A 04/27/2015    Procedure: ESOPHAGOGASTRODUODENOSCOPY (EGD) WITH PROPOFOL;  Surgeon: Scot Jun, MD;  Location: [facility] ENDOSCOPY;  Service: Endoscopy;  Laterality: N/A;    Past Surgical History  Procedure Laterality Date  . Nephrolithotomy  1972  . Sinus irrigation    . Excision benign skin lesion ears    . Kidney stones removed    . Esophagogastroduodenoscopy (egd) with propofol N/A 04/27/2015    Procedure: ESOPHAGOGASTRODUODENOSCOPY (EGD) WITH PROPOFOL;  Surgeon: Scot Jun, MD;  Location: [facility] ENDOSCOPY;  Service: Endoscopy;  Laterality: N/A;    Current Outpatient Rx  Name  Route  Sig  Dispense  Refill  . allopurinol (ZYLOPRIM) 300 MG tablet   Oral   Take 300 mg by mouth daily.          . Alum & Mag Hydroxide-Simeth (GI COCKTAIL) SUSP suspension   Oral   Take 5 mLs by mouth every 4 (four) hours as needed for indigestion.          Marland Kitchen  amLODipine (NORVASC) 5 MG tablet   Oral   Take 5 mg by mouth daily.         Marland Kitchen b complex vitamins tablet   Oral   Take 1 tablet by mouth daily.         . carvedilol (COREG) 3.125 MG tablet   Oral   Take 1 tablet (3.125 mg total) by mouth 2 (two) times daily with a meal.   60 tablet   0   . diazepam (VALIUM) 5 MG tablet   Oral   Take 1 tablet (5 mg total) by mouth every 6 (six) hours as needed for anxiety or muscle spasms. Patient not taking: Reported on 05/16/2015   30 tablet   0   . docusate sodium (COLACE) 100 MG capsule   Oral   Take 100 mg by mouth 2 (two)  times daily.         . insulin aspart protamine- aspart (NOVOLOG MIX 70/30) (70-30) 100 UNIT/ML injection   Subcutaneous   Inject 10-12 Units into the skin 2 (two) times daily with a meal.          . insulin glargine (LANTUS) 100 UNIT/ML injection   Subcutaneous   Inject 0.07 mLs (7 Units total) into the skin at bedtime.   10 mL   11   . lactulose (CHRONULAC) 10 GM/15ML solution   Oral   Take 20 g by mouth daily as needed for mild constipation.          Marland Kitchen levofloxacin (LEVAQUIN) 750 MG tablet   Oral   Take 1 tablet (750 mg total) by mouth every other day.   4 tablet   0   . levothyroxine (SYNTHROID, LEVOTHROID) 50 MCG tablet   Oral   Take 50 mcg by mouth daily before breakfast.          . lovastatin (MEVACOR) 20 MG tablet   Oral   Take 20 mg by mouth at bedtime.          . ondansetron (ZOFRAN) 4 MG tablet   Oral   Take 1 tablet (4 mg total) by mouth daily as needed for nausea or vomiting. Patient not taking: Reported on 06/02/2015   10 tablet   0   . oxyCODONE-acetaminophen (PERCOCET/ROXICET) 5-325 MG tablet   Oral   Take 1 tablet by mouth every 4 (four) hours as needed for moderate pain.   40 tablet   0   . pantoprazole (PROTONIX) 40 MG tablet   Oral   Take 40 mg by mouth 2 (two) times daily.          . polyethylene glycol (MIRALAX / GLYCOLAX) packet   Oral   Take 17 g by mouth 2 (two) times daily.   14 each   0   . psyllium (REGULOID) 0.52 g capsule   Oral   Take 0.52 g by mouth daily.         . quinapril (ACCUPRIL) 20 MG tablet   Oral   Take 20 mg by mouth daily.          Marland Kitchen saccharomyces boulardii (FLORASTOR) 250 MG capsule   Oral   Take 250 mg by mouth 2 (two) times daily.         Marland Kitchen senna-docusate (SENOKOT-S) 8.6-50 MG tablet   Oral   Take 2 tablets by mouth daily.         . sucralfate (CARAFATE) 1 g tablet   Oral   Take 1 g  by mouth 4 (four) times daily -  with meals and at bedtime.         . tamsulosin (FLOMAX) 0.4 MG  CAPS capsule   Oral   Take 0.4 mg by mouth daily after breakfast.          . traMADol (ULTRAM) 50 MG tablet   Oral   Take 1-2 tablets (50-100 mg total) by mouth every 6 (six) hours as needed for moderate pain. Patient not taking: Reported on 05/16/2015   50 tablet   0     Allergies:  Aspirin; Metformin; Pioglitazone; and Rosiglitazone  Family History: Family History  Problem Relation Age of Onset  . Hematuria Neg Hx   . Kidney cancer Neg Hx   . Prostate cancer Neg Hx   . Sickle cell anemia Neg Hx   . Tuberculosis Neg Hx   . Diabetes Mellitus II Mother   . Hypertension Mother   . Stroke Father     Social History: Social History  Substance Use Topics  . Smoking status: Former Games developermoker  . Smokeless tobacco: Current User    Types: Chew  . Alcohol Use: No     Review of Systems:   10 point review of systems was performed and was otherwise negative:  Constitutional: No fever Eyes: No visual disturbances ENT: No sore throat, ear pain Cardiac: No chest pain Respiratory: No shortness of breath, wheezing, or stridor Abdomen: No abdominal pain, no vomiting, No diarrhea Endocrine: No weight loss, No night sweats Extremities: No peripheral edema, cyanosis Skin: No rashes, easy bruising Neurologic: No focal weakness, trouble with speech or swollowing Urologic: No dysuria, Hematuria, or urinary frequency   Physical Exam:  ED Triage Vitals  Enc Vitals Group     BP 06/05/15 0932 134/60 mmHg     Pulse Rate 06/05/15 0932 82     Resp 06/05/15 0932 16     Temp 06/05/15 0932 98.3 F (36.8 C)     Temp Source 06/05/15 0932 Oral     SpO2 06/05/15 0932 94 %     Weight 06/05/15 0929 278 lb (126.1 kg)     Height 06/05/15 0929 5\' 6"  (1.676 m)     Head Cir --      Peak Flow --      Pain Score --      Pain Loc --      Pain Edu? --      Excl. in GC? --     General: Awake , Alert , and Oriented times 3; GCS 15 Head: Normal cephalic , atraumatic Eyes: Pupils equal , round,  reactive to light Nose/Throat: No nasal drainage, patent upper airway without erythema or exudate.  Neck: Supple, Full range of motion, No anterior adenopathy or palpable thyroid masses Lungs: Clear to ascultation without wheezes , rhonchi, or rales Heart: Regular rate, regular rhythm without murmurs , gallops , or rubs Abdomen: Soft, non tender without rebound, guarding , or rigidity; bowel sounds positive and symmetric in all 4 quadrants. No organomegaly .        Extremities: 2 plus symmetric pulses. No edema, clubbing or cyanosis Neurologic: Motor 4 out of 5 and symmetric in both upper and lower extremities without deficits, sensory intact Skin: warm, dry, no rashes   Labs:   All laboratory work was reviewed including any pertinent negatives or positives listed below:  Labs Reviewed  CBC - Abnormal; Notable for the following:    RBC 3.07 (*)    Hemoglobin 9.2 (*)  HCT 27.6 (*)    Platelets 508 (*)    All other components within normal limits  BASIC METABOLIC PANEL  TROPONIN I    EKG:  ED ECG REPORT I, Jennye Moccasin, the attending physician, personally viewed and interpreted this ECG.  Date: 06/05/2015 EKG Time: 0934 Rate: 81 Rhythm: normal sinus rhythm QRS Axis: normal Intervals: normal ST/T Wave abnormalities: normal Conduction Disturbances: none Narrative Interpretation: unremarkable Old inferior and anterior changes without any acute ischemic changes    ED Course: Patient is repeat laboratory work and EKG etc. appears to be within normal limits. There does not appear to be anything new going on except his hemoglobin is slightly decreased compared to previous values. Patient had a rectal exam which was guaiac negative with normal-appearing stool in the rectal vault. He otherwise exhibits no signs of focal weakness and the plan is to continue with rehabilitation therapy. The patient is now willing to go to a rehabilitation center. Social work has been consulted and  we will follow their recommendations especially placement is established.   Assessment:  Generalized weakness Mild anemia      Plan:  Placement for rehabilitation            Jennye Moccasin, MD 06/05/15 1415

## 2015-06-05 NOTE — Progress Notes (Signed)
LCSW met with family and discussed possible SNF options, completed Fl2 and assessment. LCSW sent request for private pay  Options for several facilities in [place] area. When offers are made family will arrange to meet them. Awaiting bed offers  Paytyn Mesta LCSW

## 2015-06-05 NOTE — Care Management Note (Signed)
Case Management Note  Patient Details  Name: Merri RayJerry G Hightower MRN: [id number] Date of Birth: 10-20-1931  Subjective/Objective:  Patient has returned to hospital because he has now decided he wants to go to rehab. Patient was here 4/4-06/04/15 and determined not to meet rehab criteria by physical therapy. At that time family wanted to pay out of pocket , but the patient refused to go. Contacted PCP today and expressed desire to go to rehab, and he referred them here to talk to social work. Called ER CSW number and left VM.                  Action/Plan:   Expected Discharge Date:                  Expected Discharge Plan:     In-House Referral:     Discharge planning Services     Post Acute Care Choice:    Choice offered to:     DME Arranged:    DME Agency:     HH Arranged:    HH Agency:     Status of Service:     Medicare Important Message Given:    Date Medicare IM Given:    Medicare IM give by:    Date Additional Medicare IM Given:    Additional Medicare Important Message give by:     If discussed at Long Length of Stay Meetings, dates discussed:    Additional Comments:  Berna BueCheryl Zaul Hubers, RN 06/05/2015, 9:53 AM

## 2015-06-05 NOTE — Care Management Note (Signed)
Case Management Note  Patient Details  Name: Juan Walker MRN: [id number] Date of Birth: January 23, 1932  Subjective/Objective: Contact made with CSW Claudine, as well as CIndy POA for the patient. Family are at bedside awaiting CSW. They are very apologetic. Reassured them that this does happen.                  Action/Plan:   Expected Discharge Date:                  Expected Discharge Plan:     In-House Referral:     Discharge planning Services     Post Acute Care Choice:    Choice offered to:     DME Arranged:    DME Agency:     HH Arranged:    HH Agency:     Status of Service:     Medicare Important Message Given:    Date Medicare IM Given:    Medicare IM give by:    Date Additional Medicare IM Given:    Additional Medicare Important Message give by:     If discussed at Long Length of Stay Meetings, dates discussed:    Additional Comments:  Berna BueCheryl Kalyna Paolella, RN 06/05/2015, 12:58 PM

## 2015-06-05 NOTE — ED Notes (Signed)
Pt alert and oriented, resting. Pt given juice and crackers.

## 2015-06-05 NOTE — Progress Notes (Signed)
LCSW worked and completed assessment and Fl2 and then talked to family members Lorin PicketScott ( grandson and his uncle and his daughter.) [facility] [phone] spoke [place] and she will put through a number and expedite number [phone]. LCSW has requested a Passr Number. Will call once permanent authorization  [facility]  [phone] Leven Hoel LCSW [phone]

## 2015-06-05 NOTE — Discharge Instructions (Signed)

## 2015-06-05 NOTE — ED Notes (Signed)
Pt arrives with complaints of weakness, states he was discharged for the hospital yesterday and was told to go to rehab but pt refused and now family feels like he is too weak to be at home, denies any pain, pt awake and alert, was in hospital for irregular EKG

## 2015-06-05 NOTE — [place] FL2 (Signed)
[place] MEDICAID FL2 LEVEL OF CARE SCREENING TOOL     IDENTIFICATION  Patient Name: Juan Walker Birthdate: Apr 29, 1931 Sex: male Admission Date (Current Location): 06/05/2015  [place] and [place] Number:  Chiropodist and Address:  [facility], [address], [place], [place] [address]      Provider Number: [id number]  Attending Physician Name and Address:  Jennye Moccasin, MD  Relative Name and Phone Number:       Current Level of Care: Hospital Recommended Level of Care: Skilled Nursing Facility Prior Approval Number:    Date Approved/Denied:   PASRR Number:    Discharge Plan: SNF    Current Diagnoses: Patient Active Problem List   Diagnosis Date Noted  . Chest pain 06/02/2015  . Syncope and collapse 05/16/2015  . SVT (supraventricular tachycardia) (HCC) 05/16/2015  . Hyponatremia 05/16/2015  . Diarrhea 05/16/2015  . Notalgia 03/29/2015  . Renal cyst, left 03/29/2015  . Left sided abdominal pain 03/29/2015  . Acquired hypothyroidism 02/16/2015  . Benign fibroma of prostate 02/16/2015  . Cataract 02/16/2015  . Chronic anemia 02/16/2015  . Diverticular disease of large intestine 02/16/2015  . Essential (primary) hypertension 02/16/2015  . Gout 02/16/2015  . Morbid obesity (HCC) 02/16/2015  . Psoriasis 02/16/2015  . Diabetes mellitus (HCC) 02/16/2015  . Microhematuria 02/16/2015  . Renal cyst 02/16/2015  . Type 2 diabetes mellitus (HCC) 08/06/2014    Orientation RESPIRATION BLADDER Height & Weight     Self, Time, Situation, Place  Normal Incontinent, Continent Weight: 278 lb (126.1 kg) Height:   (167.6 cm)  BEHAVIORAL SYMPTOMS/MOOD NEUROLOGICAL BOWEL NUTRITION STATUS      Incontinent, Continent Diet (Normal)  AMBULATORY STATUS COMMUNICATION OF NEEDS Skin   Limited Assist Verbally Normal                       Personal Care Assistance Level of Assistance  Bathing, Feeding, Dressing, Total care Bathing  Assistance: Maximum assistance Feeding assistance: Maximum assistance Dressing Assistance: Maximum assistance Total Care Assistance: Maximum assistance   Functional Limitations Info  Sight, Hearing, Speech Sight Info: Adequate Hearing Info: Adequate Speech Info: Adequate    SPECIAL CARE FACTORS FREQUENCY                       Contractures Contractures Info: Not present    Additional Factors Info  Allergies   Allergies Info: Asperin,metformin,pioglitazone,roziglitazone           Current Medications (06/05/2015):  This is the current hospital active medication list No current facility-administered medications for this encounter.   Current Outpatient Prescriptions  Medication Sig Dispense Refill  . allopurinol (ZYLOPRIM) 300 MG tablet Take 300 mg by mouth daily.     . Alum & Mag Hydroxide-Simeth (GI COCKTAIL) SUSP suspension Take 5 mLs by mouth every 4 (four) hours as needed for indigestion.     Marland Kitchen amLODipine (NORVASC) 5 MG tablet Take 5 mg by mouth daily.    Marland Kitchen b complex vitamins tablet Take 1 tablet by mouth daily.    . carvedilol (COREG) 3.125 MG tablet Take 1 tablet (3.125 mg total) by mouth 2 (two) times daily with a meal. 60 tablet 0  . diazepam (VALIUM) 5 MG tablet Take 1 tablet (5 mg total) by mouth every 6 (six) hours as needed for anxiety or muscle spasms. (Patient not taking: Reported on 05/16/2015) 30 tablet 0  . docusate sodium (COLACE) 100 MG capsule Take  100 mg by mouth 2 (two) times daily.    . insulin aspart protamine- aspart (NOVOLOG MIX 70/30) (70-30) 100 UNIT/ML injection Inject 10-12 Units into the skin 2 (two) times daily with a meal.     . insulin glargine (LANTUS) 100 UNIT/ML injection Inject 0.07 mLs (7 Units total) into the skin at bedtime. 10 mL 11  . lactulose (CHRONULAC) 10 GM/15ML solution Take 20 g by mouth daily as needed for mild constipation.     Marland Kitchen. levofloxacin (LEVAQUIN) 750 MG tablet Take 1 tablet (750 mg total) by mouth every other day. 4  tablet 0  . levothyroxine (SYNTHROID, LEVOTHROID) 50 MCG tablet Take 50 mcg by mouth daily before breakfast.     . lovastatin (MEVACOR) 20 MG tablet Take 20 mg by mouth at bedtime.     . ondansetron (ZOFRAN) 4 MG tablet Take 1 tablet (4 mg total) by mouth daily as needed for nausea or vomiting. (Patient not taking: Reported on 06/02/2015) 10 tablet 0  . oxyCODONE-acetaminophen (PERCOCET/ROXICET) 5-325 MG tablet Take 1 tablet by mouth every 4 (four) hours as needed for moderate pain. 40 tablet 0  . pantoprazole (PROTONIX) 40 MG tablet Take 40 mg by mouth 2 (two) times daily.     . polyethylene glycol (MIRALAX / GLYCOLAX) packet Take 17 g by mouth 2 (two) times daily. 14 each 0  . psyllium (REGULOID) 0.52 g capsule Take 0.52 g by mouth daily.    . quinapril (ACCUPRIL) 20 MG tablet Take 20 mg by mouth daily.     Marland Kitchen. saccharomyces boulardii (FLORASTOR) 250 MG capsule Take 250 mg by mouth 2 (two) times daily.    Marland Kitchen. senna-docusate (SENOKOT-S) 8.6-50 MG tablet Take 2 tablets by mouth daily.    . sucralfate (CARAFATE) 1 g tablet Take 1 g by mouth 4 (four) times daily -  with meals and at bedtime.    . tamsulosin (FLOMAX) 0.4 MG CAPS capsule Take 0.4 mg by mouth daily after breakfast.     . traMADol (ULTRAM) 50 MG tablet Take 1-2 tablets (50-100 mg total) by mouth every 6 (six) hours as needed for moderate pain. (Patient not taking: Reported on 05/16/2015) 50 tablet 0     Discharge Medications: Please see discharge summary for a list of discharge medications.  Relevant Imaging Results:  Relevant Lab Results:   Additional Information SSN [id number]  Cheron SchaumannBandi, Lashena Signer M, [place]

## 2015-06-05 NOTE — Progress Notes (Signed)
LCSW will engage with patient at 1:30pm. Arrie Senatelaudine Gerrett Loman LCSW [phone]

## 2015-06-08 ENCOUNTER — Encounter: Payer: Self-pay | Admitting: Internal Medicine

## 2015-06-08 ENCOUNTER — Non-Acute Institutional Stay (SKILLED_NURSING_FACILITY): Payer: PPO | Admitting: Internal Medicine

## 2015-06-08 DIAGNOSIS — J189 Pneumonia, unspecified organism: Secondary | ICD-10-CM | POA: Diagnosis not present

## 2015-06-08 DIAGNOSIS — M549 Dorsalgia, unspecified: Secondary | ICD-10-CM

## 2015-06-08 DIAGNOSIS — K5900 Constipation, unspecified: Secondary | ICD-10-CM

## 2015-06-08 DIAGNOSIS — N400 Enlarged prostate without lower urinary tract symptoms: Secondary | ICD-10-CM | POA: Diagnosis not present

## 2015-06-08 DIAGNOSIS — E039 Hypothyroidism, unspecified: Secondary | ICD-10-CM | POA: Diagnosis not present

## 2015-06-08 DIAGNOSIS — E785 Hyperlipidemia, unspecified: Secondary | ICD-10-CM

## 2015-06-08 DIAGNOSIS — M109 Gout, unspecified: Secondary | ICD-10-CM

## 2015-06-08 DIAGNOSIS — K219 Gastro-esophageal reflux disease without esophagitis: Secondary | ICD-10-CM | POA: Diagnosis not present

## 2015-06-08 DIAGNOSIS — E871 Hypo-osmolality and hyponatremia: Secondary | ICD-10-CM

## 2015-06-08 DIAGNOSIS — R531 Weakness: Secondary | ICD-10-CM

## 2015-06-08 DIAGNOSIS — N183 Chronic kidney disease, stage 3 unspecified: Secondary | ICD-10-CM

## 2015-06-08 DIAGNOSIS — E46 Unspecified protein-calorie malnutrition: Secondary | ICD-10-CM

## 2015-06-08 DIAGNOSIS — I10 Essential (primary) hypertension: Secondary | ICD-10-CM

## 2015-06-08 DIAGNOSIS — E1122 Type 2 diabetes mellitus with diabetic chronic kidney disease: Secondary | ICD-10-CM | POA: Diagnosis not present

## 2015-06-08 DIAGNOSIS — K5909 Other constipation: Secondary | ICD-10-CM

## 2015-06-08 NOTE — Progress Notes (Signed)
LOCATION: [facility]  PCP: Marisue Ivan, MD   Code Status: Full Code  Goals of care: Advanced Directive information Advanced Directives 06/02/2015  Does patient have an advance directive? Yes  Type of Advance Directive Living will  Does patient want to make changes to advanced directive? -  Copy of advanced directive(s) in chart? No - copy requested       Extended Emergency Contact Information Primary Emergency Contact: Swiggett,Cynthia R Address: [address]          [place], [place] [address] [place] Phone: [phone] Relation: Daughter   Allergies  Allergen Reactions  . Aspirin Nausea And Vomiting and Other (See Comments)    Reaction:  Constipation  . Metformin Nausea And Vomiting and Other (See Comments)    Pt states that this medication makes his blood clot.    . Pioglitazone Other (See Comments)    Reaction:  Left-sided weakness  . Rosiglitazone Other (See Comments)    Reaction:  Left-sided weakness    Chief Complaint  Patient presents with  . New Admit To SNF    New Admission     HPI:  Patient is a 82 y.o. male seen today for short term rehabilitation post hospital admission from 06/02/15-06/04/15 with chest pain and confusion. Cardiac etiology was ruled out. He was started on antibiotics for CAP. He had CT head which ruled out acute intracranial abnormality. He was discharged home and came to the ED with weakness on 06/05/15. He has PMH of ckd stage 3, dm, hyperlipidemia, HTN among others. He is seen in his room today.  Review of Systems: limited, has some confusion.  Constitutional: Negative for fever, chills, diaphoresis. Has low energy level.  HENT: Negative for headache, congestion, nasal discharge. Eyes: Negative for blurred vision, double vision and discharge. Wears glasses.  Respiratory: Negative for shortness of breath and wheezing. Positive for cough.  Cardiovascular: Negative for chest pain, palpitations, leg swelling.    Gastrointestinal: Negative for heartburn, nausea, vomiting, abdominal pain.  Genitourinary: Negative for dysuria   Musculoskeletal: Negative for fall in the facility.  Neurological: Negative for dizziness.    Past Medical History  Diagnosis Date  . Essential (primary) hypertension 02/16/2015  . Diverticular disease of large intestine 02/16/2015  . Acquired hypothyroidism 02/16/2015  . Diabetes mellitus (HCC) 02/16/2015    Overview:  insulin 2005 last eye exam 01/2013 [facility]   . Depression   . Glaucoma   . Gout   . Renal disorder   . BPH (benign prostatic hyperplasia)   . BPH (benign prostatic hypertrophy)   . Cataract     Right eye  . Anemia     chronic  . Diverticulosis of colon without hemorrhage   . Gout   . History of prostatitis   . Morbid obesity (HCC)   . Psoriasis   . Shingles   . Renal insufficiency    Past Surgical History  Procedure Laterality Date  . Nephrolithotomy  1972  . Sinus irrigation    . Excision benign skin lesion ears    . Kidney stones removed    . Esophagogastroduodenoscopy (egd) with propofol N/A 04/27/2015    Procedure: ESOPHAGOGASTRODUODENOSCOPY (EGD) WITH PROPOFOL;  Surgeon: Scot Jun, MD;  Location: [facility] ENDOSCOPY;  Service: Endoscopy;  Laterality: N/A;   Social History:   reports that he has quit smoking. His smokeless tobacco use includes Chew. He reports that he does not drink alcohol or use illicit drugs.  Family  History  Problem Relation Age of Onset  . Hematuria Neg Hx   . Kidney cancer Neg Hx   . Prostate cancer Neg Hx   . Sickle cell anemia Neg Hx   . Tuberculosis Neg Hx   . Diabetes Mellitus II Mother   . Hypertension Mother   . Stroke Father     Medications:   Medication List       This list is accurate as of: 06/08/15  2:58 PM.  Always use your most recent med list.               allopurinol 300 MG tablet  Commonly known as:  ZYLOPRIM  Take 300 mg by mouth daily.     amLODipine 5 MG  tablet  Commonly known as:  NORVASC  Take 5 mg by mouth daily.     b complex vitamins tablet  Take 1 tablet by mouth daily.     carvedilol 3.125 MG tablet  Commonly known as:  COREG  Take 1 tablet (3.125 mg total) by mouth 2 (two) times daily with a meal.     docusate sodium 100 MG capsule  Commonly known as:  COLACE  Take 100 mg by mouth 2 (two) times daily.     gi cocktail Susp suspension  Take 5 mLs by mouth every 4 (four) hours as needed for indigestion.     insulin lispro 100 UNIT/ML injection  Commonly known as:  HUMALOG  Inject into the skin 3 (three) times daily before meals. 150-200= 2 units, 201-250= 4 units, 251-300 units= 6 units, 301-350= 8 units, 351-400 units= 10 units, >400 CALL MD     lactulose 10 GM/15ML solution  Commonly known as:  CHRONULAC  Take 20 g by mouth daily as needed for mild constipation.     levofloxacin 750 MG tablet  Commonly known as:  LEVAQUIN  Take 1 tablet (750 mg total) by mouth every other day.     levothyroxine 50 MCG tablet  Commonly known as:  SYNTHROID, LEVOTHROID  Take 50 mcg by mouth daily before breakfast.     lovastatin 20 MG tablet  Commonly known as:  MEVACOR  Take 20 mg by mouth at bedtime.     oxyCODONE-acetaminophen 5-325 MG tablet  Commonly known as:  PERCOCET/ROXICET  Take 1 tablet by mouth every 4 (four) hours as needed for moderate pain.     pantoprazole 40 MG tablet  Commonly known as:  PROTONIX  Take 40 mg by mouth 2 (two) times daily.     polyethylene glycol packet  Commonly known as:  MIRALAX / GLYCOLAX  Take 17 g by mouth 2 (two) times daily.     psyllium 0.52 g capsule  Commonly known as:  REGULOID  Take 0.52 g by mouth daily.     quinapril 20 MG tablet  Commonly known as:  ACCUPRIL  Take 20 mg by mouth daily.     saccharomyces boulardii 250 MG capsule  Commonly known as:  FLORASTOR  Take 250 mg by mouth 2 (two) times daily.     senna-docusate 8.6-50 MG tablet  Commonly known as:  Senokot-S   Take 2 tablets by mouth daily.     sucralfate 1 g tablet  Commonly known as:  CARAFATE  Take 1 g by mouth 4 (four) times daily -  with meals and at bedtime.     tamsulosin 0.4 MG Caps capsule  Commonly known as:  FLOMAX  Take 0.4 mg by mouth daily after  breakfast.        Immunizations: Immunization History  Administered Date(s) Administered  . PPD Test 06/05/2015     Physical Exam  Filed Vitals:   06/08/15 1446  BP: 122/66  Pulse: 84  Temp: 97.4 F (36.3 C)  TempSrc: Oral  Resp: 18  Height:  (1.676 m)  Weight: 278 lb (126.1 kg)  SpO2: 97%   Body mass index is 44.89 kg/(m^2).  General- elderly obese male, in no acute distress Head- normocephalic, atraumatic Nose-no maxillary or frontal sinus tenderness, no nasal discharge Throat- moist mucus membrane  Eyes- PERRLA, EOMI, no pallor, no icterus, no discharge Neck- no cervical lymphadenopathy Cardiovascular- normal s1,s2, no murmur, no leg edema Respiratory- bilateral clear to auscultation, no wheeze, no rhonchi, no crackles, no use of accessory muscles Abdomen- bowel sounds present, soft, non tender Musculoskeletal- able to move all 4 extremities, generalized weakness more to lower extremities Neurological- alert and oriented to person and place only, mild left facial droop noted (per patient this is chronic) Skin- warm and dry Psychiatry- poor attention span and jumps from one topic to another during conversation    Labs reviewed: Basic Metabolic Panel:  Recent Labs  47/82/95 0534  06/03/15 0125 06/04/15 0540 06/05/15 06/05/15 0932  NA 133*  < > 132* 134* 133* 133*  K 4.1  < > 4.2 4.1  --  4.2  CL 105  < > 104 105  --  104  CO2 24  < > 21* 21*  --  20*  GLUCOSE 109*  < > 110* 129*  --  163*  BUN 27*  < > 30* 32* 35* 35*  CREATININE 1.73*  < > 1.48* 1.44* 1.7* 1.71*  CALCIUM 8.1*  < > 8.1* 8.1*  --  8.5*  MG 2.1  --   --   --   --   --   < > = values in this interval not displayed. Liver  Function Tests:  Recent Labs  05/16/15 1112 05/17/15 0534 06/02/15 1449  AST 48* 49* 45*  ALT 32 35 33  ALKPHOS 101 101 110  BILITOT 0.5 0.6 0.6  PROT 7.1 6.2* 6.9  ALBUMIN 2.8* 2.4* 2.4*    Recent Labs  04/28/15 1011  LIPASE 17   No results for input(s): AMMONIA in the last 8760 hours. CBC:  Recent Labs  02/19/15 1337  04/28/15 1011  05/21/15 0929 06/02/15 1449 06/05/15 06/05/15 0932  WBC 6.3  < > 8.1  < > 7.3 8.5 9.4 9.4  NEUTROABS 3.8  --  6.3  --   --   --   --   --   HGB 12.4*  < > 11.3*  < > 10.7* 9.7*  --  9.2*  HCT 38.1*  < > 33.3*  < > 32.6* 28.9*  --  27.6*  MCV 95.0  < > 92.4  < > 91.5 89.6  --  89.8  PLT 199  < > 277  < > 343 466*  --  508*  < > = values in this interval not displayed. Cardiac Enzymes:  Recent Labs  06/03/15 0125 06/03/15 0732 06/05/15 0932  TROPONINI <0.03 <0.03 <0.03   BNP: Invalid input(s): POCBNP CBG:  Recent Labs  06/03/15 2241 06/04/15 0758 06/04/15 1154  GLUCAP 111* 127* 163*    Radiological Exams: Dg Elbow Complete Left  05/16/2015  CLINICAL DATA:  Left elbow pain after fall EXAM: LEFT ELBOW - COMPLETE 3+ VIEW COMPARISON:  None. FINDINGS: No fracture,  joint effusion, dislocation or appreciable arthropathy. Small exostosis at the anterior distal left humeral shaft. Small enthesophyte at the posterior olecranon. Small enthesophyte at the medial epicondyle. IMPRESSION: No fracture, joint effusion or malalignment in the left elbow. Electronically Signed   By: Delbert PhenixJason A Poff M.D.   On: 05/16/2015 15:09   Ct Head Wo Contrast  06/03/2015  CLINICAL DATA:  Headaches off and on for 2 days EXAM: CT HEAD WITHOUT CONTRAST TECHNIQUE: Contiguous axial images were obtained from the base of the skull through the vertex without intravenous contrast. COMPARISON:  05/16/2015 FINDINGS: The bony calvarium is intact. No gross soft tissue abnormality is noted. Diffuse atrophic changes and chronic white matter ischemic change is again  identified. No findings to suggest acute hemorrhage, acute infarction or space-occupying mass lesion are seen. IMPRESSION: Chronic atrophic and ischemic changes without acute abnormality. Electronically Signed   By: Alcide CleverMark  Lukens M.D.   On: 06/03/2015 17:53   Ct Head Wo Contrast  05/16/2015  CLINICAL DATA:  Syncope with fall.  Hypotension. EXAM: CT HEAD WITHOUT CONTRAST TECHNIQUE: Contiguous axial images were obtained from the base of the skull through the vertex without intravenous contrast. COMPARISON:  Head CT March 30, 2010; brain MRI June 27, 2011 FINDINGS: There is mild diffuse atrophy. There is no intracranial mass hemorrhage, extra-axial fluid collection, or midline shift. There is mild patchy small vessel disease in the centra semiovale bilaterally. Elsewhere gray-white compartments appear normal. No acute infarct evident. The bony calvarium appears intact. Mastoids on the left are clear. There is opacification of several inferior mastoid air cells on the right. There is probable cerumen in the right external auditory canal. The patient has had previous antrostomies bilaterally. There is mucosal thickening throughout multiple ethmoid sinuses as well as mucosal thickening in the inferior anterior maxillary antra bilaterally. No intraorbital lesions are apparent. IMPRESSION: Atrophy with mild periventricular small vessel disease, stable. No acute infarct. No hemorrhage or mass effect. Opacification of several inferior right-sided mastoid air cells. Mastoids on the left are clear. Areas of paranasal sinus disease. Patient has had prior antrostomies bilaterally. There is probable cerumen in the right external auditory canal. Electronically Signed   By: Bretta BangWilliam  Woodruff III M.D.   On: 05/16/2015 14:33   [place] Carotid Bilateral  05/16/2015  CLINICAL DATA:  Syncope EXAM: BILATERAL CAROTID DUPLEX ULTRASOUND TECHNIQUE: Wallace CullensGray scale imaging, color Doppler and duplex ultrasound were performed of bilateral carotid  and vertebral arteries in the neck. COMPARISON:  None. FINDINGS: Criteria: Quantification of carotid stenosis is based on velocity parameters that correlate the residual internal carotid diameter with NASCET-based stenosis levels, using the diameter of the distal internal carotid lumen as the denominator for stenosis measurement. The following velocity measurements were obtained: RIGHT ICA:  121/32 cm/sec CCA:  77/13 cm/sec SYSTOLIC ICA/CCA RATIO:  1.6 DIASTOLIC ICA/CCA RATIO:  1.9 ECA:  111 cm/sec LEFT ICA:  108/34 cm/sec CCA:  103/15 cm/sec SYSTOLIC ICA/CCA RATIO:  1.1 DIASTOLIC ICA/CCA RATIO:  2.4 ECA:  120 cm/sec RIGHT CAROTID ARTERY: There is a small amount of partially calcified plaque in the bulb and proximal internal carotid artery. RIGHT VERTEBRAL ARTERY:  Antegrade flow LEFT CAROTID ARTERY: There is a small amount of both calcified and noncalcified plaque in the bulb. LEFT VERTEBRAL ARTERY:  Antegrade flow IMPRESSION: There is a small amount of plaque bilaterally with velocity measurements not suggesting hemodynamically significant stenosis there Electronically Signed   By: Esperanza Heiraymond  Rubner M.D.   On: 05/16/2015 17:13   Nm Myocar Multi W/spect  W/wall Motion / Ef  06/03/2015   The study is normal.  This is a low risk study.  The left ventricular ejection fraction is normal (55-65%).  There was no ST segment deviation noted during stress.    Dg Chest Port 1 View  06/02/2015  CLINICAL DATA:  Chest pain for 2 weeks EXAM: PORTABLE CHEST 1 VIEW COMPARISON:  05/16/2015 FINDINGS: Heterogeneous opacities in the left mid and lower lung zone have developed. Left pleural effusion is suspected. Right lung is clear. Normal heart size. IMPRESSION: Opacities in the left lung have developed with a suspected pleural effusion worrisome for pneumonia. Followup PA and lateral chest X-ray is recommended in 3-4 weeks following trial of antibiotic therapy to ensure resolution and exclude underlying malignancy.  Electronically Signed   By: Jolaine Click M.D.   On: 06/02/2015 15:00   Dg Chest Portable 1 View  05/16/2015  CLINICAL DATA:  Shortness of breath.  Recent fall EXAM: PORTABLE CHEST 1 VIEW COMPARISON:  April 28, 2015 FINDINGS: There is no edema or consolidation. There is stable minimal scarring in the left base. Heart is upper normal in size with pulmonary vascularity within normal limits. No adenopathy. No bone lesions. IMPRESSION: No edema or consolidation.  Minimal scarring left base, stable. Electronically Signed   By: Bretta Bang III M.D.   On: 05/16/2015 11:53   Dg Shoulder Left  05/16/2015  CLINICAL DATA:  Fall today with left shoulder pain, initial encounter EXAM: LEFT SHOULDER - 2+ VIEW COMPARISON:  None. FINDINGS: Mild degenerative changes of the acromioclavicular joint and glenohumeral articulation are seen. No definitive acute fracture or dislocation is noted. No gross soft tissue abnormality is seen. IMPRESSION: Degenerative change without acute abnormality. Electronically Signed   By: Alcide Clever M.D.   On: 05/16/2015 15:07   Dg Abd 2 Views  05/17/2015  CLINICAL DATA:  Abdominal pain EXAM: ABDOMEN - 2 VIEW COMPARISON:  04/28/2015 FINDINGS: Scattered large and small bowel gas is noted. No obstructive changes are seen. No free air is noted. No abnormal mass or abnormal calcifications are seen. Degenerative changes of lumbar spine are seen. IMPRESSION: No acute abnormality noted. Electronically Signed   By: Alcide Clever M.D.   On: 05/17/2015 18:19    Assessment/Plan  Generalized weakness Will have him work with physical therapy and occupational therapy team to help with gait training and muscle strengthening exercises.fall precautions. Skin care. Encourage to be out of bed.   CAP Continue and complete course of levaquin qod on 06/12/15. Add incentive spirometer. Continue florastor  Hyponatremia Check bmp  ckd stage 3 Monitor bmp  Protein calorie malnutrition Get dietary  consult  Gout No recent flare up, continue allopurinol  HTN Monitor bp, continue amlodipine 5 mg daily, coreg 3.125 mg bid, quinapril 20 mg daily.   Constipation Continue colace bid, miralax bid, senokot s daily and prn lactulose  DM No results found for: HGBA1C Check a1c. Continue novolog mix bid. Monitor cbg  HLD Continue statin. Check lipid panel and ck given her weakness and if ck elevated, d/c statin  gerd Continue protonix 40 mg bid and carafate. Controlled symptom  BPH Continue flomax  Notalgia Continue prn percocet and prn tramadol for now.   Hypothyroidism Lab Results  Component Value Date   TSH 4.064 06/04/2015   Continue synthroid for now   Goals of care: short term rehabilitation   Labs/tests ordered: cbc, cmp, a1c   Family/ staff Communication: reviewed care plan with patient and nursing supervisor  Blanchie Serve, MD Internal Medicine [facility] [address] [place], [place] [address] Cell Phone (Monday-Friday 8 am - 5 pm): [phone] On Call: [phone] and follow prompts after 5 pm and on weekends Office Phone: [phone] Office Fax: [phone]

## 2015-06-09 LAB — HEPATIC FUNCTION PANEL
Alkaline Phosphatase: 188 U/L — AB (ref 25–125)
Bilirubin, Total: 0.8 mg/dL

## 2015-06-09 LAB — BASIC METABOLIC PANEL
BUN: 57 mg/dL — AB (ref 4–21)
Creatinine: 2 mg/dL — AB (ref 0.6–1.3)
GLUCOSE: 137 mg/dL
POTASSIUM: 4.2 mmol/L (ref 3.4–5.3)
SODIUM: 138 mmol/L (ref 137–147)

## 2015-06-09 LAB — CBC AND DIFFERENTIAL
HCT: 25 % — AB (ref 41–53)
HEMOGLOBIN: 7.8 g/dL — AB (ref 13.5–17.5)
Platelets: 427 10*3/uL — AB (ref 150–399)
WBC: 9.1 10^3/mL

## 2015-06-09 LAB — LIPID PANEL
Cholesterol: 139 mg/dL (ref 0–200)
HDL: 37 mg/dL (ref 35–70)
LDL CALC: 87 mg/dL
Triglycerides: 71 mg/dL (ref 40–160)

## 2015-06-09 LAB — HEMOGLOBIN A1C: HEMOGLOBIN A1C: 7

## 2015-06-10 ENCOUNTER — Other Ambulatory Visit: Payer: Self-pay | Admitting: *Deleted

## 2015-06-10 ENCOUNTER — Encounter: Payer: Self-pay | Admitting: Family

## 2015-06-10 ENCOUNTER — Non-Acute Institutional Stay (SKILLED_NURSING_FACILITY): Payer: PPO | Admitting: Family

## 2015-06-10 DIAGNOSIS — F411 Generalized anxiety disorder: Secondary | ICD-10-CM | POA: Diagnosis not present

## 2015-06-10 DIAGNOSIS — F32A Depression, unspecified: Secondary | ICD-10-CM

## 2015-06-10 DIAGNOSIS — G4700 Insomnia, unspecified: Secondary | ICD-10-CM | POA: Diagnosis not present

## 2015-06-10 DIAGNOSIS — F329 Major depressive disorder, single episode, unspecified: Secondary | ICD-10-CM

## 2015-06-10 DIAGNOSIS — R748 Abnormal levels of other serum enzymes: Secondary | ICD-10-CM

## 2015-06-10 MED ORDER — MELATONIN 3 MG PO TABS: 3.0000 mg | ORAL_TABLET | Freq: Every day | ORAL | Status: AC

## 2015-06-10 MED ORDER — FERROUS SULFATE 325 (65 FE) MG PO TBEC: 325.0000 mg | DELAYED_RELEASE_TABLET | Freq: Three times a day (TID) | ORAL | Status: AC

## 2015-06-10 MED ORDER — OXYCODONE-ACETAMINOPHEN 5-325 MG PO TABS: 1.0000 | ORAL_TABLET | Freq: Four times a day (QID) | ORAL | Status: AC | PRN

## 2015-06-10 MED ORDER — SERTRALINE HCL 50 MG PO TABS: 25.0000 mg | ORAL_TABLET | Freq: Every day | ORAL | Status: AC

## 2015-06-10 MED ORDER — OXYCODONE-ACETAMINOPHEN 5-325 MG PO TABS: 1.0000 | ORAL_TABLET | Freq: Four times a day (QID) | ORAL | Status: DC | PRN

## 2015-06-10 NOTE — Progress Notes (Signed)
Location:  [facility] Room Number: 1204 Place of Service:  SNF [phone]) Provider:  Marlowe Sax, FNP-C  Dion Body, MD  Patient Care Team: Dion Body, MD as PCP - General (Family Medicine)  Extended Emergency Contact Information Primary Emergency Contact: Swiggett,Cynthia R Address: [address]          [place], [place] [address] [place] Mobile Phone: [phone] Relation: Daughter  Code Status: Full Code  Goals of care: Advanced Directive information Advanced Directives 06/02/2015  Does patient have an advance directive? Yes  Type of Advance Directive Living will  Does patient want to make changes to advanced directive? -  Copy of advanced directive(s) in chart? No - copy requested     Chief Complaint  Patient presents with  . Acute Visit    Increased Anxiety  . FYI    Lab Report dated 06/09/15 showed ALT 104 and AST 152(unable to abstract values due to higher reading)    HPI:  Pt is a 82 y.o. male seen today at [facility] for an acute visit for evaluation of anxiety. He has a medical history of HTN, DM, Depression, Hypothyroidism, gout, BPH, CKD stage 3 among others. He is seen in his room today per patient's nurse request. Facility Nurse reports patient fell out of the wheelchair prior to visit. Also Nurse reports patient has been anxious. Patient states just slid off the wheelchair no injury sustained. He states was taking Valium at home for anxiety. He reports feeling depressed and wants to give up " I'm too old anyway". He denies any suicide ideation.    Past Medical History  Diagnosis Date  . Essential (primary) hypertension 02/16/2015  . Diverticular disease of large intestine 02/16/2015  . Acquired hypothyroidism 02/16/2015  . Diabetes mellitus ([place]) 02/16/2015    Overview:  insulin 2005 last eye exam 01/2013 [facility]   . Depression   . Glaucoma   . Gout   . Renal disorder     . BPH (benign prostatic hyperplasia)   . BPH (benign prostatic hypertrophy)   . Cataract     Right eye  . Anemia     chronic  . Diverticulosis of colon without hemorrhage   . Gout   . History of prostatitis   . Morbid obesity ([place])   . Psoriasis   . Shingles   . Renal insufficiency    Past Surgical History  Procedure Laterality Date  . Nephrolithotomy  1972  . Sinus irrigation    . Excision benign skin lesion ears    . Kidney stones removed    . Esophagogastroduodenoscopy (egd) with propofol N/A 04/27/2015    Procedure: ESOPHAGOGASTRODUODENOSCOPY (EGD) WITH PROPOFOL;  Surgeon: Manya Silvas, MD;  Location: [facility];  Service: Endoscopy;  Laterality: N/A;    Allergies  Allergen Reactions  . Aspirin Nausea And Vomiting and Other (See Comments)    Reaction:  Constipation  . Metformin Nausea And Vomiting and Other (See Comments)    Pt states that this medication makes his blood clot.    . Pioglitazone Other (See Comments)    Reaction:  Left-sided weakness  . Rosiglitazone Other (See Comments)    Reaction:  Left-sided weakness      Medication List       This list is accurate as of: 06/10/15  4:44 PM.  Always use your most recent med list.  amLODipine 5 MG tablet  Commonly known as:  NORVASC  Take 5 mg by mouth daily.     b complex vitamins tablet  Take 1 tablet by mouth daily.     carvedilol 3.125 MG tablet  Commonly known as:  COREG  Take 1 tablet (3.125 mg total) by mouth 2 (two) times daily with a meal.     docusate sodium 100 MG capsule  Commonly known as:  COLACE  Take 100 mg by mouth 2 (two) times daily.     ferrous sulfate 325 (65 FE) MG EC tablet  Take 1 tablet (325 mg total) by mouth 3 (three) times daily with meals.     gi cocktail Susp suspension  Take 5 mLs by mouth every 4 (four) hours as needed for indigestion.     insulin lispro 100 UNIT/ML injection  Commonly known as:  HUMALOG  Inject into the skin 3 (three) times  daily before meals. 150-200= 2 units, 201-250= 4 units, 251-300 units= 6 units, 301-350= 8 units, 351-400 units= 10 units, >400 CALL MD     lactulose 10 GM/15ML solution  Commonly known as:  CHRONULAC  Take 30 g by mouth daily as needed for mild constipation.     levofloxacin 750 MG tablet  Commonly known as:  LEVAQUIN  Take 1 tablet (750 mg total) by mouth every other day.     levothyroxine 50 MCG tablet  Commonly known as:  SYNTHROID, LEVOTHROID  Take 50 mcg by mouth daily before breakfast.     Melatonin 3 MG Tabs  Take 1 tablet (3 mg total) by mouth at bedtime.     oxyCODONE-acetaminophen 5-325 MG tablet  Commonly known as:  PERCOCET/ROXICET  Take 1 tablet by mouth every 6 (six) hours as needed for moderate pain.     pantoprazole 40 MG tablet  Commonly known as:  PROTONIX  Take 40 mg by mouth 2 (two) times daily.     polyethylene glycol packet  Commonly known as:  MIRALAX / GLYCOLAX  Take 17 g by mouth 2 (two) times daily.     psyllium 0.52 g capsule  Commonly known as:  REGULOID  Take 0.52 g by mouth daily.     quinapril 20 MG tablet  Commonly known as:  ACCUPRIL  Take 20 mg by mouth daily.     saccharomyces boulardii 250 MG capsule  Commonly known as:  FLORASTOR  Take 250 mg by mouth 2 (two) times daily.     senna-docusate 8.6-50 MG tablet  Commonly known as:  Senokot-S  Take 2 tablets by mouth daily.     sertraline 50 MG tablet  Commonly known as:  ZOLOFT  Take 0.5 tablets (25 mg total) by mouth daily.     sucralfate 1 g tablet  Commonly known as:  CARAFATE  Take 1 g by mouth 4 (four) times daily -  with meals and at bedtime.     tamsulosin 0.4 MG Caps capsule  Commonly known as:  FLOMAX  Take 0.4 mg by mouth daily after breakfast.        Review of Systems  Constitutional: Negative for fever, chills, activity change, appetite change and fatigue.  HENT: Negative.   Eyes: Negative.   Respiratory: Negative.   Cardiovascular: Negative.     Gastrointestinal: Positive for constipation. Negative for nausea, vomiting, abdominal pain, diarrhea and abdominal distention.  Endocrine: Negative.   Genitourinary: Negative.   Musculoskeletal: Positive for gait problem.       Fall X  2   Skin: Negative.   Neurological: Negative for dizziness, seizures, syncope, light-headedness and headaches.  Psychiatric/Behavioral: Positive for sleep disturbance. Negative for suicidal ideas, hallucinations, behavioral problems, confusion, self-injury and agitation. The patient is nervous/anxious.     Immunization History  Administered Date(s) Administered  . PPD Test 06/05/2015   Pertinent  Health Maintenance Due  Topic Date Due  . HEMOGLOBIN A1C  Dec 03, 1933  . FOOT EXAM  08/26/1943  . OPHTHALMOLOGY EXAM  08/26/1943  . PNA vac Low Risk Adult (1 of 2 - PCV13) 08/26/1998  . INFLUENZA VACCINE  09/29/2015   No flowsheet data found. Functional Status Survey:    Filed Vitals:   06/10/15 1116  BP: 118/56  Pulse: 79  Temp: 98 F (36.7 C)  Resp: 18  Height: 5' 6"  (1.676 m)  Weight: 278 lb (126.1 kg)   Body mass index is 44.89 kg/(m^2). Physical Exam  Constitutional: He appears well-developed and well-nourished. No distress.  HENT:  Head: Normocephalic.  Mouth/Throat: Oropharynx is clear and moist.  Eyes: Conjunctivae and EOM are normal. Pupils are equal, round, and reactive to light. Right eye exhibits no discharge. Left eye exhibits no discharge. No scleral icterus.  Neck: Normal range of motion. No JVD present. No thyromegaly present.  Cardiovascular: Normal rate, regular rhythm, normal heart sounds and intact distal pulses.  Exam reveals no gallop and no friction rub.   No murmur heard. Pulmonary/Chest: Effort normal and breath sounds normal. No respiratory distress. He has no wheezes. He has no rales.  Abdominal: Soft. Bowel sounds are normal. He exhibits no distension and no mass. There is no tenderness. There is no rebound and no  guarding.  Musculoskeletal: Normal range of motion. He exhibits no edema or tenderness.  Lymphadenopathy:    He has no cervical adenopathy.  Neurological: He is alert.  Skin: Skin is warm and dry. No rash noted. No erythema. No pallor.  Psychiatric:  Anxious and depressed. Despair.     Labs reviewed:  Recent Labs  05/17/15 0534  06/03/15 0125 06/04/15 0540 06/05/15 06/05/15 0932 06/09/15  NA 133*  < > 132* 134* 133* 133* 138  K 4.1  < > 4.2 4.1  --  4.2 4.2  CL 105  < > 104 105  --  104  --   CO2 24  < > 21* 21*  --  20*  --   GLUCOSE 109*  < > 110* 129*  --  163*  --   BUN 27*  < > 30* 32* 35* 35* 57*  CREATININE 1.73*  < > 1.48* 1.44* 1.7* 1.71* 2.0*  CALCIUM 8.1*  < > 8.1* 8.1*  --  8.5*  --   MG 2.1  --   --   --   --   --   --   < > = values in this interval not displayed.  Recent Labs  05/16/15 1112 05/17/15 0534 06/02/15 1449 06/09/15  AST 48* 49* 45*  --   ALT 32 35 33  --   ALKPHOS 101 101 110 188*  BILITOT 0.5 0.6 0.6  --   PROT 7.1 6.2* 6.9  --   ALBUMIN 2.8* 2.4* 2.4*  --     Recent Labs  02/19/15 1337  04/28/15 1011  05/21/15 0929 06/02/15 1449 06/05/15 06/05/15 0932 06/09/15  WBC 6.3  < > 8.1  < > 7.3 8.5 9.4 9.4 9.1  NEUTROABS 3.8  --  6.3  --   --   --   --   --   --  HGB 12.4*  < > 11.3*  < > 10.7* 9.7*  --  9.2* 7.8*  HCT 38.1*  < > 33.3*  < > 32.6* 28.9*  --  27.6* 25*  MCV 95.0  < > 92.4  < > 91.5 89.6  --  89.8  --   PLT 199  < > 277  < > 343 466*  --  508* 427*  < > = values in this interval not displayed. Lab Results  Component Value Date   TSH 4.064 06/04/2015   Lab Results  Component Value Date   HGBA1C 7.0 06/09/2015   Lab Results  Component Value Date   CHOL 139 06/09/2015   HDL 37 06/09/2015   LDLCALC 87 06/09/2015   TRIG 71 06/09/2015    Significant Diagnostic Results in last 30 days:  Dg Elbow Complete Left  05/16/2015  CLINICAL DATA:  Left elbow pain after fall EXAM: LEFT ELBOW - COMPLETE 3+ VIEW COMPARISON:   None. FINDINGS: No fracture, joint effusion, dislocation or appreciable arthropathy. Small exostosis at the anterior distal left humeral shaft. Small enthesophyte at the posterior olecranon. Small enthesophyte at the medial epicondyle. IMPRESSION: No fracture, joint effusion or malalignment in the left elbow. Electronically Signed   By: Ilona Sorrel M.D.   On: 05/16/2015 15:09   Ct Head Wo Contrast  06/03/2015  CLINICAL DATA:  Headaches off and on for 2 days EXAM: CT HEAD WITHOUT CONTRAST TECHNIQUE: Contiguous axial images were obtained from the base of the skull through the vertex without intravenous contrast. COMPARISON:  05/16/2015 FINDINGS: The bony calvarium is intact. No gross soft tissue abnormality is noted. Diffuse atrophic changes and chronic white matter ischemic change is again identified. No findings to suggest acute hemorrhage, acute infarction or space-occupying mass lesion are seen. IMPRESSION: Chronic atrophic and ischemic changes without acute abnormality. Electronically Signed   By: Inez Catalina M.D.   On: 06/03/2015 17:53   Ct Head Wo Contrast  05/16/2015  CLINICAL DATA:  Syncope with fall.  Hypotension. EXAM: CT HEAD WITHOUT CONTRAST TECHNIQUE: Contiguous axial images were obtained from the base of the skull through the vertex without intravenous contrast. COMPARISON:  Head CT March 30, 2010; brain MRI June 27, 2011 FINDINGS: There is mild diffuse atrophy. There is no intracranial mass hemorrhage, extra-axial fluid collection, or midline shift. There is mild patchy small vessel disease in the centra semiovale bilaterally. Elsewhere gray-white compartments appear normal. No acute infarct evident. The bony calvarium appears intact. Mastoids on the left are clear. There is opacification of several inferior mastoid air cells on the right. There is probable cerumen in the right external auditory canal. The patient has had previous antrostomies bilaterally. There is mucosal thickening  throughout multiple ethmoid sinuses as well as mucosal thickening in the inferior anterior maxillary antra bilaterally. No intraorbital lesions are apparent. IMPRESSION: Atrophy with mild periventricular small vessel disease, stable. No acute infarct. No hemorrhage or mass effect. Opacification of several inferior right-sided mastoid air cells. Mastoids on the left are clear. Areas of paranasal sinus disease. Patient has had prior antrostomies bilaterally. There is probable cerumen in the right external auditory canal. Electronically Signed   By: Lowella Grip III M.D.   On: 05/16/2015 14:33   US Carotid Bilateral  05/16/2015  CLINICAL DATA:  Syncope EXAM: BILATERAL CAROTID DUPLEX ULTRASOUND TECHNIQUE: Pearline Cables scale imaging, color Doppler and duplex ultrasound were performed of bilateral carotid and vertebral arteries in the neck. COMPARISON:  None. FINDINGS: Criteria: Quantification of carotid stenosis  is based on velocity parameters that correlate the residual internal carotid diameter with NASCET-based stenosis levels, using the diameter of the distal internal carotid lumen as the denominator for stenosis measurement. The following velocity measurements were obtained: RIGHT ICA:  121/32 cm/sec CCA:  28/36 cm/sec SYSTOLIC ICA/CCA RATIO:  1.6 DIASTOLIC ICA/CCA RATIO:  1.9 ECA:  111 cm/sec LEFT ICA:  108/34 cm/sec CCA:  629/47 cm/sec SYSTOLIC ICA/CCA RATIO:  1.1 DIASTOLIC ICA/CCA RATIO:  2.4 ECA:  120 cm/sec RIGHT CAROTID ARTERY: There is a small amount of partially calcified plaque in the bulb and proximal internal carotid artery. RIGHT VERTEBRAL ARTERY:  Antegrade flow LEFT CAROTID ARTERY: There is a small amount of both calcified and noncalcified plaque in the bulb. LEFT VERTEBRAL ARTERY:  Antegrade flow IMPRESSION: There is a small amount of plaque bilaterally with velocity measurements not suggesting hemodynamically significant stenosis there Electronically Signed   By: Skipper Cliche M.D.   On: 05/16/2015  17:13   Nm Myocar Multi W/spect W/wall Motion / Ef  06/03/2015   The study is normal.  This is a low risk study.  The left ventricular ejection fraction is normal (55-65%).  There was no ST segment deviation noted during stress.    Dg Chest Port 1 View  06/02/2015  CLINICAL DATA:  Chest pain for 2 weeks EXAM: PORTABLE CHEST 1 VIEW COMPARISON:  05/16/2015 FINDINGS: Heterogeneous opacities in the left mid and lower lung zone have developed. Left pleural effusion is suspected. Right lung is clear. Normal heart size. IMPRESSION: Opacities in the left lung have developed with a suspected pleural effusion worrisome for pneumonia. Followup PA and lateral chest X-ray is recommended in 3-4 weeks following trial of antibiotic therapy to ensure resolution and exclude underlying malignancy. Electronically Signed   By: Marybelle Killings M.D.   On: 06/02/2015 15:00   Dg Chest Portable 1 View  05/16/2015  CLINICAL DATA:  Shortness of breath.  Recent fall EXAM: PORTABLE CHEST 1 VIEW COMPARISON:  April 28, 2015 FINDINGS: There is no edema or consolidation. There is stable minimal scarring in the left base. Heart is upper normal in size with pulmonary vascularity within normal limits. No adenopathy. No bone lesions. IMPRESSION: No edema or consolidation.  Minimal scarring left base, stable. Electronically Signed   By: Lowella Grip III M.D.   On: 05/16/2015 11:53   Dg Shoulder Left  05/16/2015  CLINICAL DATA:  Fall today with left shoulder pain, initial encounter EXAM: LEFT SHOULDER - 2+ VIEW COMPARISON:  None. FINDINGS: Mild degenerative changes of the acromioclavicular joint and glenohumeral articulation are seen. No definitive acute fracture or dislocation is noted. No gross soft tissue abnormality is seen. IMPRESSION: Degenerative change without acute abnormality. Electronically Signed   By: Inez Catalina M.D.   On: 05/16/2015 15:07   Dg Abd 2 Views  05/17/2015  CLINICAL DATA:  Abdominal pain EXAM: ABDOMEN - 2  VIEW COMPARISON:  04/28/2015 FINDINGS: Scattered large and small bowel gas is noted. No obstructive changes are seen. No free air is noted. No abnormal mass or abnormal calcifications are seen. Degenerative changes of lumbar spine are seen. IMPRESSION: No acute abnormality noted. Electronically Signed   By: Inez Catalina M.D.   On: 05/17/2015 18:19    Assessment/Plan Depression Currently no medication. Start sertraline 25 mg Tablet at bedtime. Psychiatry consult. Patient states wants to give up.Continue to monitor for mood changes.  Generalized anxiety  Has been on valium 5 mg Tablet at home prior to hospital visit. Has  fallen twice in the facility will hold on starting back on valium due to falls. Sertraline 25 mg Tablet daily. Insomnia  Melatonin 3 mg Tablet at bedtime.  Elevated liver Enzymes  Alk 188,ALT, AST (06/09/2015). Consulted Dr. Bubba Camp Discontinue Lovastatin and Allopurinol. Change Oxycodone-APAP 5/325 mg Tablet to every 6 HRs PRN for pain. BMP 06/17/2015 Lipid panel in 3 months.     Family/ staff Communication: Reviewed plan of care with patient, Patient's daughter Michai Dieppa at [phone], DR. Bubba Camp and Pharmacist, hospital.   Labs/tests ordered:  BMP 06/17/2015 Lipid panel in 3 months.

## 2015-06-11 ENCOUNTER — Non-Acute Institutional Stay (SKILLED_NURSING_FACILITY): Payer: PPO | Admitting: Family

## 2015-06-11 DIAGNOSIS — N189 Chronic kidney disease, unspecified: Secondary | ICD-10-CM | POA: Diagnosis not present

## 2015-06-11 DIAGNOSIS — R638 Other symptoms and signs concerning food and fluid intake: Secondary | ICD-10-CM | POA: Diagnosis not present

## 2015-06-11 DIAGNOSIS — D649 Anemia, unspecified: Secondary | ICD-10-CM

## 2015-06-11 DIAGNOSIS — N179 Acute kidney failure, unspecified: Secondary | ICD-10-CM

## 2015-06-11 NOTE — Progress Notes (Signed)
Patient ID: DAELON DUNIVAN, male   DOB: 04/16/1931, 82 y.o.   MRN: [id number]  Location:  [facility]   Place of Service:  SNF [phone]) Provider: Donalee Citrin Konstantine Gervasi FNP-C Oneal Grout, MD   Marisue Ivan, MD  Patient Care Team: Marisue Ivan, MD as PCP - General (Family Medicine)  Extended Emergency Contact Information Primary Emergency Contact: Swiggett,Cynthia R Address: [address]          [place], [place] [address] [place] Mobile Phone: [phone] Relation: Daughter  Code Status:  Full Code  Goals of care: Advanced Directive information Advanced Directives 06/02/2015  Does patient have an advance directive? Yes  Type of Advance Directive Living will  Does patient want to make changes to advanced directive? -  Copy of advanced directive(s) in chart? No - copy requested     Chief Complaint  Patient presents with  . Acute Visit    abnormal labs     HPI:  Pt is a 82 y.o. male seen today at [facility]  for an acute visit for evaluation of abnormal lab result.He has a medical history of HTN, CKD, Type 2 DM, Depression, CKD stage 3, BPH among others. He is seen in his room today per facility Nurse request. Facility Nurse reports patient's B/p 69/52 rechecked manually 98/48 and 121/98. Patient CNA states patient has refused meals takes few bits then wants to go to his room. Patient states does not feel like eating. He complains on his chronic back pain controlled by current regimen.Lab results done 06/11/2015 showed CR 3.15, BUN 25.4, Hgb 7.9 ( previous 7.8).    Past Medical History  Diagnosis Date  . Essential (primary) hypertension 02/16/2015  . Diverticular disease of large intestine 02/16/2015  . Acquired hypothyroidism 02/16/2015  . Diabetes mellitus (HCC) 02/16/2015    Overview:  insulin 2005 last eye exam 01/2013 [facility]   . Depression   . Glaucoma   . Gout   . Renal disorder   . BPH (benign prostatic  hyperplasia)   . BPH (benign prostatic hypertrophy)   . Cataract     Right eye  . Anemia     chronic  . Diverticulosis of colon without hemorrhage   . Gout   . History of prostatitis   . Morbid obesity (HCC)   . Psoriasis   . Shingles   . Renal insufficiency    Past Surgical History  Procedure Laterality Date  . Nephrolithotomy  1972  . Sinus irrigation    . Excision benign skin lesion ears    . Kidney stones removed    . Esophagogastroduodenoscopy (egd) with propofol N/A 04/27/2015    Procedure: ESOPHAGOGASTRODUODENOSCOPY (EGD) WITH PROPOFOL;  Surgeon: Scot Jun, MD;  Location: [facility];  Service: Endoscopy;  Laterality: N/A;    Allergies  Allergen Reactions  . Aspirin Nausea And Vomiting and Other (See Comments)    Reaction:  Constipation  . Metformin Nausea And Vomiting and Other (See Comments)    Pt states that this medication makes his blood clot.    . Pioglitazone Other (See Comments)    Reaction:  Left-sided weakness  . Rosiglitazone Other (See Comments)    Reaction:  Left-sided weakness      Medication List       This list is accurate as of: 06/11/15  6:03 PM.  Always use your most recent med list.  amLODipine 5 MG tablet  Commonly known as:  NORVASC  Take 5 mg by mouth daily.     b complex vitamins tablet  Take 1 tablet by mouth daily.     carvedilol 3.125 MG tablet  Commonly known as:  COREG  Take 1 tablet (3.125 mg total) by mouth 2 (two) times daily with a meal.     docusate sodium 100 MG capsule  Commonly known as:  COLACE  Take 100 mg by mouth 2 (two) times daily.     ferrous sulfate 325 (65 FE) MG EC tablet  Take 1 tablet (325 mg total) by mouth 3 (three) times daily with meals.     gi cocktail Susp suspension  Take 5 mLs by mouth every 4 (four) hours as needed for indigestion.     insulin lispro 100 UNIT/ML injection  Commonly known as:  HUMALOG  Inject into the skin 3 (three) times daily before meals.  150-200= 2 units, 201-250= 4 units, 251-300 units= 6 units, 301-350= 8 units, 351-400 units= 10 units, >400 CALL MD     lactulose 10 GM/15ML solution  Commonly known as:  CHRONULAC  Take 30 g by mouth daily as needed for mild constipation.     levofloxacin 750 MG tablet  Commonly known as:  LEVAQUIN  Take 1 tablet (750 mg total) by mouth every other day.     levothyroxine 50 MCG tablet  Commonly known as:  SYNTHROID, LEVOTHROID  Take 50 mcg by mouth daily before breakfast.     Melatonin 3 MG Tabs  Take 1 tablet (3 mg total) by mouth at bedtime.     oxyCODONE-acetaminophen 5-325 MG tablet  Commonly known as:  PERCOCET/ROXICET  Take 1 tablet by mouth every 6 (six) hours as needed for moderate pain.     pantoprazole 40 MG tablet  Commonly known as:  PROTONIX  Take 40 mg by mouth 2 (two) times daily.     polyethylene glycol packet  Commonly known as:  MIRALAX / GLYCOLAX  Take 17 g by mouth 2 (two) times daily.     psyllium 0.52 g capsule  Commonly known as:  REGULOID  Take 0.52 g by mouth daily.     quinapril 20 MG tablet  Commonly known as:  ACCUPRIL  Take 20 mg by mouth daily.     saccharomyces boulardii 250 MG capsule  Commonly known as:  FLORASTOR  Take 250 mg by mouth 2 (two) times daily.     senna-docusate 8.6-50 MG tablet  Commonly known as:  Senokot-S  Take 2 tablets by mouth daily.     sertraline 50 MG tablet  Commonly known as:  ZOLOFT  Take 0.5 tablets (25 mg total) by mouth daily.     sucralfate 1 g tablet  Commonly known as:  CARAFATE  Take 1 g by mouth 4 (four) times daily -  with meals and at bedtime.     tamsulosin 0.4 MG Caps capsule  Commonly known as:  FLOMAX  Take 0.4 mg by mouth daily after breakfast.        Review of Systems  Constitutional: Positive for activity change and appetite change. Negative for fever, chills and fatigue.  HENT: Negative.   Eyes: Negative.   Respiratory: Negative.   Cardiovascular: Negative.     Gastrointestinal: Negative for nausea, vomiting, abdominal pain, diarrhea, constipation and abdominal distention.  Endocrine: Negative.   Genitourinary: Negative.   Musculoskeletal: Positive for gait problem.       Fall  X 2   Skin: Negative.   Neurological: Negative for dizziness, seizures, syncope, light-headedness and headaches.  Psychiatric/Behavioral: Negative for suicidal ideas, hallucinations, behavioral problems, confusion, sleep disturbance, self-injury and agitation. The patient is nervous/anxious.     Immunization History  Administered Date(s) Administered  . PPD Test 06/05/2015   Pertinent  Health Maintenance Due  Topic Date Due  . FOOT EXAM  08/26/1943  . OPHTHALMOLOGY EXAM  08[birth year]26/1943  . PNA vac Low Risk Adult (1 of 2 - PCV13) 08/26/1998  . INFLUENZA VACCINE  09/29/2015  . HEMOGLOBIN A1C  12/09/2015   No flowsheet data found. Functional Status Survey:    Filed Vitals:   06/11/15 1745  BP: 121/98  Pulse: 78  Temp: 98.1 F (36.7 C)  Resp: 18  Height:  (1.676 m)  Weight: 278 lb (126.1 kg)  SpO2: 95%   Body mass index is 44.89 kg/(m^2). Physical Exam  Constitutional: He appears well-developed and well-nourished. No distress.  HENT:  Head: Normocephalic.  Mouth/Throat: Oropharynx is clear and moist.  Eyes: Conjunctivae and EOM are normal. Pupils are equal, round, and reactive to light. Right eye exhibits no discharge. Left eye exhibits no discharge. No scleral icterus.  Neck: Normal range of motion. No JVD present.  Cardiovascular: Normal rate, regular rhythm, normal heart sounds and intact distal pulses.  Exam reveals no gallop and no friction rub.   No murmur heard. Pulmonary/Chest: Effort normal and breath sounds normal. No respiratory distress. He has no wheezes. He has no rales.  Abdominal: Soft. Bowel sounds are normal. He exhibits no distension. There is no tenderness. There is no rebound and no guarding.  Musculoskeletal: He exhibits no  tenderness.  Normal ROM except lower back limited due to pain   Lymphadenopathy:    He has no cervical adenopathy.  Neurological: He is alert.  Skin: Skin is warm and dry. No rash noted. No erythema. No pallor.  Psychiatric:  Depressed     Labs reviewed:  Recent Labs  05/17/15 0534  06/03/15 0125 06/04/15 0540 06/05/15 06/05/15 0932 06/09/15  NA 133*  < > 132* 134* 133* 133* 138  K 4.1  < > 4.2 4.1  --  4.2 4.2  CL 105  < > 104 105  --  104  --   CO2 24  < > 21* 21*  --  20*  --   GLUCOSE 109*  < > 110* 129*  --  163*  --   BUN 27*  < > 30* 32* 35* 35* 57*  CREATININE 1.73*  < > 1.48* 1.44* 1.7* 1.71* 2.0*  CALCIUM 8.1*  < > 8.1* 8.1*  --  8.5*  --   MG 2.1  --   --   --   --   --   --   < > = values in this interval not displayed.  Recent Labs  05/16/15 1112 05/17/15 0534 06/02/15 1449 06/09/15  AST 48* 49* 45*  --   ALT 32 35 33  --   ALKPHOS 101 101 110 188*  BILITOT 0.5 0.6 0.6  --   PROT 7.1 6.2* 6.9  --   ALBUMIN 2.8* 2.4* 2.4*  --     Recent Labs  02/19/15 1337  04/28/15 1011  05/21/15 0929 06/02/15 1449 06/05/15 06/05/15 0932 06/09/15  WBC 6.3  < > 8.1  < > 7.3 8.5 9.4 9.4 9.1  NEUTROABS 3.8  --  6.3  --   --   --   --   --   --  HGB 12.4*  < > 11.3*  < > 10.7* 9.7*  --  9.2* 7.8*  HCT 38.1*  < > 33.3*  < > 32.6* 28.9*  --  27.6* 25*  MCV 95.0  < > 92.4  < > 91.5 89.6  --  89.8  --   PLT 199  < > 277  < > 343 466*  --  508* 427*  < > = values in this interval not displayed. Lab Results  Component Value Date   TSH 4.064 06/04/2015   Lab Results  Component Value Date   HGBA1C 7.0 06/09/2015   Lab Results  Component Value Date   CHOL 139 06/09/2015   HDL 37 06/09/2015   LDLCALC 87 06/09/2015   TRIG 71 06/09/2015    Significant Diagnostic Results in last 30 days:  Dg Elbow Complete Left  05/16/2015  CLINICAL DATA:  Left elbow pain after fall EXAM: LEFT ELBOW - COMPLETE 3+ VIEW COMPARISON:  None. FINDINGS: No fracture, joint effusion,  dislocation or appreciable arthropathy. Small exostosis at the anterior distal left humeral shaft. Small enthesophyte at the posterior olecranon. Small enthesophyte at the medial epicondyle. IMPRESSION: No fracture, joint effusion or malalignment in the left elbow. Electronically Signed   By: Delbert PhenixJason A Poff M.D.   On: 05/16/2015 15:09   Ct Head Wo Contrast  06/03/2015  CLINICAL DATA:  Headaches off and on for 2 days EXAM: CT HEAD WITHOUT CONTRAST TECHNIQUE: Contiguous axial images were obtained from the base of the skull through the vertex without intravenous contrast. COMPARISON:  05/16/2015 FINDINGS: The bony calvarium is intact. No gross soft tissue abnormality is noted. Diffuse atrophic changes and chronic white matter ischemic change is again identified. No findings to suggest acute hemorrhage, acute infarction or space-occupying mass lesion are seen. IMPRESSION: Chronic atrophic and ischemic changes without acute abnormality. Electronically Signed   By: Alcide CleverMark  Lukens M.D.   On: 06/03/2015 17:53   Ct Head Wo Contrast  05/16/2015  CLINICAL DATA:  Syncope with fall.  Hypotension. EXAM: CT HEAD WITHOUT CONTRAST TECHNIQUE: Contiguous axial images were obtained from the base of the skull through the vertex without intravenous contrast. COMPARISON:  Head CT March 30, 2010; brain MRI June 27, 2011 FINDINGS: There is mild diffuse atrophy. There is no intracranial mass hemorrhage, extra-axial fluid collection, or midline shift. There is mild patchy small vessel disease in the centra semiovale bilaterally. Elsewhere gray-white compartments appear normal. No acute infarct evident. The bony calvarium appears intact. Mastoids on the left are clear. There is opacification of several inferior mastoid air cells on the right. There is probable cerumen in the right external auditory canal. The patient has had previous antrostomies bilaterally. There is mucosal thickening throughout multiple ethmoid sinuses as well as  mucosal thickening in the inferior anterior maxillary antra bilaterally. No intraorbital lesions are apparent. IMPRESSION: Atrophy with mild periventricular small vessel disease, stable. No acute infarct. No hemorrhage or mass effect. Opacification of several inferior right-sided mastoid air cells. Mastoids on the left are clear. Areas of paranasal sinus disease. Patient has had prior antrostomies bilaterally. There is probable cerumen in the right external auditory canal. Electronically Signed   By: Bretta BangWilliam  Woodruff III M.D.   On: 05/16/2015 14:33   [place] Carotid Bilateral  05/16/2015  CLINICAL DATA:  Syncope EXAM: BILATERAL CAROTID DUPLEX ULTRASOUND TECHNIQUE: Wallace CullensGray scale imaging, color Doppler and duplex ultrasound were performed of bilateral carotid and vertebral arteries in the neck. COMPARISON:  None. FINDINGS: Criteria: Quantification of carotid stenosis  is based on velocity parameters that correlate the residual internal carotid diameter with NASCET-based stenosis levels, using the diameter of the distal internal carotid lumen as the denominator for stenosis measurement. The following velocity measurements were obtained: RIGHT ICA:  121/32 cm/sec CCA:  77/13 cm/sec SYSTOLIC ICA/CCA RATIO:  1.6 DIASTOLIC ICA/CCA RATIO:  1.9 ECA:  111 cm/sec LEFT ICA:  108/34 cm/sec CCA:  103/15 cm/sec SYSTOLIC ICA/CCA RATIO:  1.1 DIASTOLIC ICA/CCA RATIO:  2.4 ECA:  120 cm/sec RIGHT CAROTID ARTERY: There is a small amount of partially calcified plaque in the bulb and proximal internal carotid artery. RIGHT VERTEBRAL ARTERY:  Antegrade flow LEFT CAROTID ARTERY: There is a small amount of both calcified and noncalcified plaque in the bulb. LEFT VERTEBRAL ARTERY:  Antegrade flow IMPRESSION: There is a small amount of plaque bilaterally with velocity measurements not suggesting hemodynamically significant stenosis there Electronically Signed   By: Esperanza Heir M.D.   On: 05/16/2015 17:13   Nm Myocar Multi W/spect W/wall  Motion / Ef  06/03/2015   The study is normal.  This is a low risk study.  The left ventricular ejection fraction is normal (55-65%).  There was no ST segment deviation noted during stress.    Dg Chest Port 1 View  06/02/2015  CLINICAL DATA:  Chest pain for 2 weeks EXAM: PORTABLE CHEST 1 VIEW COMPARISON:  05/16/2015 FINDINGS: Heterogeneous opacities in the left mid and lower lung zone have developed. Left pleural effusion is suspected. Right lung is clear. Normal heart size. IMPRESSION: Opacities in the left lung have developed with a suspected pleural effusion worrisome for pneumonia. Followup PA and lateral chest X-ray is recommended in 3-4 weeks following trial of antibiotic therapy to ensure resolution and exclude underlying malignancy. Electronically Signed   By: Jolaine Click M.D.   On: 06/02/2015 15:00   Dg Chest Portable 1 View  05/16/2015  CLINICAL DATA:  Shortness of breath.  Recent fall EXAM: PORTABLE CHEST 1 VIEW COMPARISON:  April 28, 2015 FINDINGS: There is no edema or consolidation. There is stable minimal scarring in the left base. Heart is upper normal in size with pulmonary vascularity within normal limits. No adenopathy. No bone lesions. IMPRESSION: No edema or consolidation.  Minimal scarring left base, stable. Electronically Signed   By: Bretta Bang III M.D.   On: 05/16/2015 11:53   Dg Shoulder Left  05/16/2015  CLINICAL DATA:  Fall today with left shoulder pain, initial encounter EXAM: LEFT SHOULDER - 2+ VIEW COMPARISON:  None. FINDINGS: Mild degenerative changes of the acromioclavicular joint and glenohumeral articulation are seen. No definitive acute fracture or dislocation is noted. No gross soft tissue abnormality is seen. IMPRESSION: Degenerative change without acute abnormality. Electronically Signed   By: Alcide Clever M.D.   On: 05/16/2015 15:07   Dg Abd 2 Views  05/17/2015  CLINICAL DATA:  Abdominal pain EXAM: ABDOMEN - 2 VIEW COMPARISON:  04/28/2015 FINDINGS:  Scattered large and small bowel gas is noted. No obstructive changes are seen. No free air is noted. No abnormal mass or abnormal calcifications are seen. Degenerative changes of lumbar spine are seen. IMPRESSION: No acute abnormality noted. Electronically Signed   By: Alcide Clever M.D.   On: 05/17/2015 18:19    Assessment/Plan 1. Decreased oral intake Facility staff reports takes few bits only.   2. Anemia, unspecified anemia type Hgb 7.9 (06/11/2015) previous 7.8 Guaiac stool X 3 days. Recheck CBC 06/12/2015  3. Acute on chronic renal failure (HCC) Hx CKD stage  3 showed CR 3.15, BUN 25.4 (06/11/2015). He has had decreased oral intake which could be contributing to this. Consulted with Dr. Glade Lloyd will start D5 1/2 Ns @ 100 ml/HR X one liter due to poor oral intake. Recheck BMP 06/12/2015.    Spend more than 45 minutes coordinating Care.   Family/ staff Communication: Reviewed plan of Care with Dr. Glade Lloyd and facility Nurse supervisor.   Labs/tests ordered: CBC, BMP 06/12/2015

## 2015-06-15 ENCOUNTER — Non-Acute Institutional Stay (SKILLED_NURSING_FACILITY): Payer: PPO | Admitting: Family

## 2015-06-15 DIAGNOSIS — Z7189 Other specified counseling: Secondary | ICD-10-CM

## 2015-06-15 DIAGNOSIS — D649 Anemia, unspecified: Secondary | ICD-10-CM

## 2015-06-15 DIAGNOSIS — N183 Chronic kidney disease, stage 3 (moderate): Secondary | ICD-10-CM | POA: Diagnosis not present

## 2015-06-15 DIAGNOSIS — N179 Acute kidney failure, unspecified: Secondary | ICD-10-CM

## 2015-06-16 ENCOUNTER — Encounter (HOSPITAL_COMMUNITY): Payer: Self-pay

## 2015-06-16 ENCOUNTER — Inpatient Hospital Stay (HOSPITAL_COMMUNITY)
Admission: EM | Admit: 2015-06-16 | Discharge: 2015-06-29 | DRG: 641 | Disposition: E | Attending: Internal Medicine | Admitting: Internal Medicine

## 2015-06-16 ENCOUNTER — Other Ambulatory Visit: Payer: Self-pay | Admitting: *Deleted

## 2015-06-16 DIAGNOSIS — R52 Pain, unspecified: Secondary | ICD-10-CM | POA: Diagnosis present

## 2015-06-16 DIAGNOSIS — F329 Major depressive disorder, single episode, unspecified: Secondary | ICD-10-CM | POA: Diagnosis present

## 2015-06-16 DIAGNOSIS — Z87891 Personal history of nicotine dependence: Secondary | ICD-10-CM

## 2015-06-16 DIAGNOSIS — Z515 Encounter for palliative care: Secondary | ICD-10-CM | POA: Diagnosis present

## 2015-06-16 DIAGNOSIS — M109 Gout, unspecified: Secondary | ICD-10-CM | POA: Diagnosis present

## 2015-06-16 DIAGNOSIS — E039 Hypothyroidism, unspecified: Secondary | ICD-10-CM | POA: Diagnosis present

## 2015-06-16 DIAGNOSIS — Z794 Long term (current) use of insulin: Secondary | ICD-10-CM

## 2015-06-16 DIAGNOSIS — R627 Adult failure to thrive: Principal | ICD-10-CM | POA: Diagnosis present

## 2015-06-16 DIAGNOSIS — I10 Essential (primary) hypertension: Secondary | ICD-10-CM | POA: Diagnosis present

## 2015-06-16 DIAGNOSIS — F0390 Unspecified dementia without behavioral disturbance: Secondary | ICD-10-CM | POA: Diagnosis present

## 2015-06-16 DIAGNOSIS — E119 Type 2 diabetes mellitus without complications: Secondary | ICD-10-CM

## 2015-06-16 DIAGNOSIS — Z6834 Body mass index (BMI) 34.0-34.9, adult: Secondary | ICD-10-CM

## 2015-06-16 DIAGNOSIS — L409 Psoriasis, unspecified: Secondary | ICD-10-CM | POA: Diagnosis present

## 2015-06-16 DIAGNOSIS — Z66 Do not resuscitate: Secondary | ICD-10-CM | POA: Diagnosis present

## 2015-06-16 DIAGNOSIS — E1122 Type 2 diabetes mellitus with diabetic chronic kidney disease: Secondary | ICD-10-CM | POA: Diagnosis present

## 2015-06-16 DIAGNOSIS — D649 Anemia, unspecified: Secondary | ICD-10-CM | POA: Diagnosis present

## 2015-06-16 DIAGNOSIS — N179 Acute kidney failure, unspecified: Secondary | ICD-10-CM | POA: Diagnosis present

## 2015-06-16 DIAGNOSIS — I129 Hypertensive chronic kidney disease with stage 1 through stage 4 chronic kidney disease, or unspecified chronic kidney disease: Secondary | ICD-10-CM | POA: Diagnosis present

## 2015-06-16 DIAGNOSIS — M549 Dorsalgia, unspecified: Secondary | ICD-10-CM | POA: Diagnosis present

## 2015-06-16 DIAGNOSIS — N183 Chronic kidney disease, stage 3 (moderate): Secondary | ICD-10-CM | POA: Diagnosis present

## 2015-06-16 MED ORDER — LORAZEPAM 0.5 MG PO TABS: 0.5000 mg | ORAL_TABLET | ORAL | Status: AC | PRN

## 2015-06-16 NOTE — ED Provider Notes (Signed)
CSN: [id number]     Arrival date & time 06/29/2015  2257 History  By signing my name below, I, Bethel Born, attest that this documentation has been prepared under the direction and in the presence of Paula Libra, MD. Electronically Signed: Bethel Born, ED Scribe. 06/17/2015. 12:12 AM    Chief Complaint  Patient presents with  . Failure To Thrive   Level V caveat due to the patient's nonverbal status   The history is provided by the patient. No language interpreter was used.   Brought in by EMS from [facility], Juan Walker is a 82 y.o. male on Hospice Care with PMHx of DM, HTN, renal insufficiency, diverticulitis, and depression who presents to the Emergency Department for pain control. The pt started Hospice Care today but his facility reportedly does not have any of the medications that he was prescribed. Family states that the facility was supposed to receive the medication last night at 11 PM but he was in too much pain to wait. Over the last 3 days he has had a general decline becoming non-verbal and not eating. He has been agitated and moaning in pain. Patient was given 250 mcg Fentanyl IV by EMS. The family does not want any diagnostic studies, their interest is in end-of-life comfort care only.  Past Medical History  Diagnosis Date  . Essential (primary) hypertension 02/16/2015  . Diverticular disease of large intestine 02/16/2015  . Acquired hypothyroidism 02/16/2015  . Diabetes mellitus (HCC) 02/16/2015    Overview:  insulin 2005 last eye exam 01/2013 [facility]   . Depression   . Glaucoma   . Gout   . Renal disorder   . BPH (benign prostatic hyperplasia)   . BPH (benign prostatic hypertrophy)   . Cataract     Right eye  . Anemia     chronic  . Diverticulosis of colon without hemorrhage   . Gout   . History of prostatitis   . Morbid obesity (HCC)   . Psoriasis   . Shingles   . Renal insufficiency    Past Surgical History  Procedure Laterality Date   . Nephrolithotomy  1972  . Sinus irrigation    . Excision benign skin lesion ears    . Kidney stones removed    . Esophagogastroduodenoscopy (egd) with propofol N/A 04/27/2015    Procedure: ESOPHAGOGASTRODUODENOSCOPY (EGD) WITH PROPOFOL;  Surgeon: Scot Jun, MD;  Location: [facility] ENDOSCOPY;  Service: Endoscopy;  Laterality: N/A;   Family History  Problem Relation Age of Onset  . Hematuria Neg Hx   . Kidney cancer Neg Hx   . Prostate cancer Neg Hx   . Sickle cell anemia Neg Hx   . Tuberculosis Neg Hx   . Diabetes Mellitus II Mother   . Hypertension Mother   . Stroke Father    Social History  Substance Use Topics  . Smoking status: Former Games developer  . Smokeless tobacco: Current User    Types: Chew  . Alcohol Use: No    Review of Systems  Unable to perform ROS: Patient nonverbal   Allergies  Aspirin; Metformin; Pioglitazone; and Rosiglitazone  Home Medications   Prior to Admission medications   Medication Sig Start Date End Date Taking? Authorizing Provider  bisacodyl (DULCOLAX) 10 MG suppository Place 10 mg rectally every 3 (three) days.   Yes Historical Provider, MD  LORazepam (ATIVAN) 0.5 MG tablet Take 1 tablet (0.5 mg total) by mouth every 4 (four) hours as needed for anxiety. Crush  and mix with warm water as directed and draw up in syringe, administer to jaw line. 05/30/2015  Yes Kimber Relic, MD  oxyCODONE (ROXICODONE INTENSOL) 20 MG/ML concentrated solution Take 5 mg by mouth every 2 (two) hours as needed for severe pain.   Yes Historical Provider, MD  Alum & Mag Hydroxide-Simeth (GI COCKTAIL) SUSP suspension Take 5 mLs by mouth every 4 (four) hours as needed for indigestion. Reported on 06/17/2015    Historical Provider, MD  amLODipine (NORVASC) 5 MG tablet Take 5 mg by mouth daily. Reported on 05/31/2015    Historical Provider, MD  b complex vitamins tablet Take 1 tablet by mouth daily. Reported on 06/02/2015    Historical Provider, MD  carvedilol (COREG) 3.125 MG  tablet Take 1 tablet (3.125 mg total) by mouth 2 (two) times daily with a meal. Patient not taking: Reported on 06/07/2015 05/21/15   Altamese Dilling, MD  docusate sodium (COLACE) 100 MG capsule Take 100 mg by mouth 2 (two) times daily. Reported on 06/04/2015    Historical Provider, MD  ferrous sulfate 325 (65 FE) MG EC tablet Take 1 tablet (325 mg total) by mouth 3 (three) times daily with meals. Patient not taking: Reported on 05/31/2015 06/10/15   Dinah C Ngetich, NP  insulin lispro (HUMALOG) 100 UNIT/ML injection Inject into the skin 3 (three) times daily before meals. Reported on 06/12/2015    Historical Provider, MD  lactulose (CHRONULAC) 10 GM/15ML solution Take 30 g by mouth daily as needed for mild constipation. Reported on 06/07/2015    Historical Provider, MD  levofloxacin (LEVAQUIN) 750 MG tablet Take 1 tablet (750 mg total) by mouth every other day. Patient not taking: Reported on 06/28/2015 06/04/15   Adrian Saran, MD  levothyroxine (SYNTHROID, LEVOTHROID) 50 MCG tablet Take 50 mcg by mouth daily before breakfast. Reported on 05/30/2015    Historical Provider, MD  Melatonin 3 MG TABS Take 1 tablet (3 mg total) by mouth at bedtime. Patient not taking: Reported on 06/22/2015 06/10/15   Donalee Citrin Ngetich, NP  oxyCODONE-acetaminophen (PERCOCET/ROXICET) 5-325 MG tablet Take 1 tablet by mouth every 6 (six) hours as needed for moderate pain. Patient not taking: Reported on 06/19/2015 06/10/15   Kimber Relic, MD  pantoprazole (PROTONIX) 40 MG tablet Take 40 mg by mouth 2 (two) times daily. Reported on 06/01/2015    Historical Provider, MD  polyethylene glycol (MIRALAX / GLYCOLAX) packet Take 17 g by mouth 2 (two) times daily. Patient not taking: Reported on 06/28/2015 05/21/15   Altamese Dilling, MD  psyllium (REGULOID) 0.52 g capsule Take 0.52 g by mouth daily. Reported on 06/17/2015    Historical Provider, MD  saccharomyces boulardii (FLORASTOR) 250 MG capsule Take 250 mg by mouth 2 (two) times  daily. Reported on 06/10/2015    Historical Provider, MD  senna-docusate (SENOKOT-S) 8.6-50 MG tablet Take 2 tablets by mouth daily. Reported on 06/07/2015    Historical Provider, MD  sertraline (ZOLOFT) 50 MG tablet Take 0.5 tablets (25 mg total) by mouth daily. Patient not taking: Reported on 06/25/2015 06/10/15   Dinah C Ngetich, NP  sucralfate (CARAFATE) 1 g tablet Take 1 g by mouth 4 (four) times daily -  with meals and at bedtime. Reported on 06/06/2015    Historical Provider, MD  tamsulosin (FLOMAX) 0.4 MG CAPS capsule Take 0.4 mg by mouth daily after breakfast. Reported on 06/26/2015    Historical Provider, MD   BP 107/85 mmHg  Pulse 84  Temp(Src) 97.7 F (36.5  C) (Axillary)  Resp 16  Ht 5\' 5"  (1.651 m)  Wt 207 lb 3.2 oz (93.985 kg)  BMI 34.48 kg/m2  SpO2 96%   Physical Exam General: Well-developed, well-nourished male in no acute distress; appearance consistent with age of record HENT: normocephalic; atraumatic Eyes: pupils equal, round and reactive to light; unable to assess extraocular muscle function Neck: supple Heart: regular rate and rhythm Lungs: clear to auscultation bilaterally Abdomen: soft; nondistended; no masses or hepatosplenomegaly; bowel sounds present Extremities: Arthritic changes; pulses normal Neurologic: Lethargic; non-verbal, noted to move all extremities; left facial droop   Skin: Warm and dry Psychiatric: non-verbal; moaning  ED Course  Procedures (including critical care time) COORDINATION OF CARE: 12:01 AM Discussed treatment plan which includes pain medication with the patient's family at bedside and they agreed to plan.   MDM  Patient started on morphine drip in ED. He will be admitted to the hospitalist service with possible transfer back to hospice care after medication issues are resolved.  Final diagnoses:  End of life care   I personally performed the services described in this documentation, which was scribed in my presence. The recorded  information has been reviewed and is accurate.    Paula LibraJohn Makenzi Bannister, MD 06/17/15 [phone]

## 2015-06-16 NOTE — ED Notes (Signed)
Per EMS [facility].  Per EMS patient has declined in the last 24 hours becoming non verbal and not eating and in a significant amount of pain.  Per EMS Hospice was contacted and EMS was advised to start pain control.  Patient given 250mcg Fentanyl en route.  Patient VS BP 111/70, PR 82, O2 94% 2L, CBG 136.

## 2015-06-16 NOTE — ED Notes (Signed)
Bed: [id number] Expected date:  Expected time:  Means of arrival:  Comments: 82 yo M  Pain control

## 2015-06-17 DIAGNOSIS — N183 Chronic kidney disease, stage 3 (moderate): Secondary | ICD-10-CM | POA: Diagnosis present

## 2015-06-17 DIAGNOSIS — R52 Pain, unspecified: Secondary | ICD-10-CM | POA: Diagnosis not present

## 2015-06-17 DIAGNOSIS — I129 Hypertensive chronic kidney disease with stage 1 through stage 4 chronic kidney disease, or unspecified chronic kidney disease: Secondary | ICD-10-CM | POA: Diagnosis present

## 2015-06-17 DIAGNOSIS — Z66 Do not resuscitate: Secondary | ICD-10-CM | POA: Diagnosis present

## 2015-06-17 DIAGNOSIS — M109 Gout, unspecified: Secondary | ICD-10-CM | POA: Diagnosis present

## 2015-06-17 DIAGNOSIS — F329 Major depressive disorder, single episode, unspecified: Secondary | ICD-10-CM | POA: Diagnosis present

## 2015-06-17 DIAGNOSIS — D649 Anemia, unspecified: Secondary | ICD-10-CM | POA: Diagnosis present

## 2015-06-17 DIAGNOSIS — N179 Acute kidney failure, unspecified: Secondary | ICD-10-CM | POA: Diagnosis present

## 2015-06-17 DIAGNOSIS — Z794 Long term (current) use of insulin: Secondary | ICD-10-CM | POA: Diagnosis not present

## 2015-06-17 DIAGNOSIS — L409 Psoriasis, unspecified: Secondary | ICD-10-CM | POA: Diagnosis present

## 2015-06-17 DIAGNOSIS — Z87891 Personal history of nicotine dependence: Secondary | ICD-10-CM | POA: Diagnosis not present

## 2015-06-17 DIAGNOSIS — Z6834 Body mass index (BMI) 34.0-34.9, adult: Secondary | ICD-10-CM | POA: Diagnosis not present

## 2015-06-17 DIAGNOSIS — E1122 Type 2 diabetes mellitus with diabetic chronic kidney disease: Secondary | ICD-10-CM | POA: Diagnosis present

## 2015-06-17 DIAGNOSIS — F0390 Unspecified dementia without behavioral disturbance: Secondary | ICD-10-CM | POA: Diagnosis present

## 2015-06-17 DIAGNOSIS — Z515 Encounter for palliative care: Secondary | ICD-10-CM | POA: Diagnosis present

## 2015-06-17 DIAGNOSIS — R627 Adult failure to thrive: Secondary | ICD-10-CM | POA: Diagnosis present

## 2015-06-17 DIAGNOSIS — E039 Hypothyroidism, unspecified: Secondary | ICD-10-CM | POA: Diagnosis present

## 2015-06-17 DIAGNOSIS — M549 Dorsalgia, unspecified: Secondary | ICD-10-CM | POA: Diagnosis present

## 2015-06-17 DIAGNOSIS — I10 Essential (primary) hypertension: Secondary | ICD-10-CM | POA: Diagnosis not present

## 2015-06-17 LAB — MRSA PCR SCREENING: MRSA BY PCR: NEGATIVE

## 2015-06-17 MED ORDER — SODIUM CHLORIDE 0.9 % IV SOLN
1.0000 mg/h | INTRAVENOUS | Status: DC
  Administered 2015-06-18: 6 mg/h via INTRAVENOUS
  Administered 2015-06-18: 5 mg/h via INTRAVENOUS
  Filled 2015-06-17: qty 10

## 2015-06-17 MED ORDER — LORAZEPAM 2 MG/ML IJ SOLN
0.5000 mg | INTRAMUSCULAR | Status: DC | PRN
  Administered 2015-06-18: 0.5 mg via INTRAVENOUS
  Filled 2015-06-17: qty 1

## 2015-06-17 MED ORDER — MORPHINE SULFATE (PF) 2 MG/ML IV SOLN
2.0000 mg | INTRAVENOUS | Status: DC | PRN
  Administered 2015-06-17: 2 mg via INTRAVENOUS

## 2015-06-17 MED ORDER — MORPHINE BOLUS VIA INFUSION
2.0000 mg | INTRAVENOUS | Status: DC | PRN
  Filled 2015-06-17: qty 2

## 2015-06-17 MED ORDER — CETYLPYRIDINIUM CHLORIDE 0.05 % MT LIQD
7.0000 mL | Freq: Two times a day (BID) | OROMUCOSAL | Status: DC
  Administered 2015-06-18: 7 mL via OROMUCOSAL

## 2015-06-17 MED ORDER — CHLORHEXIDINE GLUCONATE 0.12 % MT SOLN
15.0000 mL | Freq: Two times a day (BID) | OROMUCOSAL | Status: DC
  Administered 2015-06-17 – 2015-06-18 (×2): 15 mL via OROMUCOSAL
  Filled 2015-06-17 (×2): qty 15

## 2015-06-17 MED ORDER — SODIUM CHLORIDE 0.9 % IV SOLN: INTRAVENOUS | Status: DC

## 2015-06-17 MED ORDER — ONDANSETRON HCL 4 MG PO TABS: 4.0000 mg | ORAL_TABLET | Freq: Four times a day (QID) | ORAL | Status: DC | PRN

## 2015-06-17 MED ORDER — ONDANSETRON HCL 4 MG/2ML IJ SOLN: 4.0000 mg | Freq: Four times a day (QID) | INTRAMUSCULAR | Status: DC | PRN

## 2015-06-17 MED ORDER — SODIUM CHLORIDE 0.9 % IV SOLN
1.0000 mg/h | INTRAVENOUS | Status: DC
  Administered 2015-06-17: 1 mg/h via INTRAVENOUS
  Filled 2015-06-17: qty 10

## 2015-06-17 MED ORDER — LORAZEPAM 2 MG/ML IJ SOLN
1.0000 mg | Freq: Once | INTRAMUSCULAR | Status: AC
  Administered 2015-06-17: 1 mg via INTRAVENOUS
  Filled 2015-06-17: qty 1

## 2015-06-17 NOTE — Progress Notes (Signed)
Patient is unresponsive to verbal and tactile stimuli. Comfortable, no agitation, no moaning. On morphine drip at 4mg /hr. Will endorsed to night nurse.

## 2015-06-17 NOTE — H&P (Signed)
[facility] History and Physical  [name] WUJ:[id number] DOB: 03-17-1931 DOA: 07/03/15  Referring physician: Paula Libra, MD. PCP: Marisue Ivan, MD   Chief Complaint: Back pain.  HPI: Juan Walker is a 82 y.o. male with below past medical history who was brought by EMS to the emergency department, accompanied by his daughter and grandson due to intractable back pain.  Per family members, the patient has been slowly declining on his mental capacity in the last few months, but over the last 2 weeks he has had a significant decline. He is now nonverbal, has not been eating or drinking any significant amounts of food or liquids in recent days.   When EMS arrived to the facility, the patient was moaning secondary to a significant amount of pain. Apparently the patient was supposed to get liquid oxycodone and lorazepam this evening, but the facility was unable to obtain them tonight and probably not on until tomorrow. He was given 250 g of fentanyl en route to the hospital and was started on a morphine continuous infusion once he arrived here, sustaining significant relief. However at times, the patient still moans in pain.  Family members do not want any diagnostic tests or procedures. They stated that the patient's wishes before he lost the capacity to make decisions, were to be treated specifically with comfort and palliative care measures .   Review of Systems:  Unable to review.  Past Medical History  Diagnosis Date  . Essential (primary) hypertension 02/16/2015  . Diverticular disease of large intestine 02/16/2015  . Acquired hypothyroidism 02/16/2015  . Diabetes mellitus (HCC) 02/16/2015    Overview:  insulin 2005 last eye exam 01/2013 [facility]   . Depression   . Glaucoma   . Gout   . Renal disorder   . BPH (benign prostatic hyperplasia)   . BPH (benign prostatic hypertrophy)   . Cataract     Right eye  . Anemia     chronic  .  Diverticulosis of colon without hemorrhage   . Gout   . History of prostatitis   . Morbid obesity (HCC)   . Psoriasis   . Shingles   . Renal insufficiency    Past Surgical History  Procedure Laterality Date  . Nephrolithotomy  1972  . Sinus irrigation    . Excision benign skin lesion ears    . Kidney stones removed    . Esophagogastroduodenoscopy (egd) with propofol N/A 04/27/2015    Procedure: ESOPHAGOGASTRODUODENOSCOPY (EGD) WITH PROPOFOL;  Surgeon: Scot Jun, MD;  Location: [facility] ENDOSCOPY;  Service: Endoscopy;  Laterality: N/A;   Social History:  reports that he has quit smoking. His smokeless tobacco use includes Chew. He reports that he does not drink alcohol or use illicit drugs.  Allergies  Allergen Reactions  . Aspirin Nausea And Vomiting and Other (See Comments)    Reaction:  Constipation  . Metformin Nausea And Vomiting and Other (See Comments)    Pt states that this medication makes his blood clot.    . Pioglitazone Other (See Comments)    Reaction:  Left-sided weakness  . Rosiglitazone Other (See Comments)    Reaction:  Left-sided weakness    Family History  Problem Relation Age of Onset  . Hematuria Neg Hx   . Kidney cancer Neg Hx   . Prostate cancer Neg Hx   . Sickle cell anemia Neg Hx   . Tuberculosis Neg Hx   . Diabetes Mellitus II Mother   .  Hypertension Mother   . Stroke Father     Prior to Admission medications   Medication Sig Start Date End Date Taking? Authorizing Provider  bisacodyl (DULCOLAX) 10 MG suppository Place 10 mg rectally every 3 (three) days.   Yes Historical Provider, MD  LORazepam (ATIVAN) 0.5 MG tablet Take 1 tablet (0.5 mg total) by mouth every 4 (four) hours as needed for anxiety. Crush and mix with warm water as directed and draw up in syringe, administer to jaw line. 09-05-2015  Yes Kimber RelicArthur G Green, MD  oxyCODONE (ROXICODONE INTENSOL) 20 MG/ML concentrated solution Take 5 mg by mouth every 2 (two) hours as needed for severe  pain.   Yes Historical Provider, MD  Alum & Mag Hydroxide-Simeth (GI COCKTAIL) SUSP suspension Take 5 mLs by mouth every 4 (four) hours as needed for indigestion. Reported on Apr 06, 2015    Historical Provider, MD  amLODipine (NORVASC) 5 MG tablet Take 5 mg by mouth daily. Reported on Apr 06, 2015    Historical Provider, MD  b complex vitamins tablet Take 1 tablet by mouth daily. Reported on Apr 06, 2015    Historical Provider, MD  carvedilol (COREG) 3.125 MG tablet Take 1 tablet (3.125 mg total) by mouth 2 (two) times daily with a meal. Patient not taking: Reported on Apr 06, 2015 05/21/15   Altamese DillingVaibhavkumar Vachhani, MD  docusate sodium (COLACE) 100 MG capsule Take 100 mg by mouth 2 (two) times daily. Reported on Apr 06, 2015    Historical Provider, MD  ferrous sulfate 325 (65 FE) MG EC tablet Take 1 tablet (325 mg total) by mouth 3 (three) times daily with meals. Patient not taking: Reported on Apr 06, 2015 06/10/15   Dinah C Ngetich, NP  insulin lispro (HUMALOG) 100 UNIT/ML injection Inject into the skin 3 (three) times daily before meals. Reported on Apr 06, 2015    Historical Provider, MD  lactulose (CHRONULAC) 10 GM/15ML solution Take 30 g by mouth daily as needed for mild constipation. Reported on Apr 06, 2015    Historical Provider, MD  levofloxacin (LEVAQUIN) 750 MG tablet Take 1 tablet (750 mg total) by mouth every other day. Patient not taking: Reported on Apr 06, 2015 06/04/15   Adrian SaranSital Mody, MD  levothyroxine (SYNTHROID, LEVOTHROID) 50 MCG tablet Take 50 mcg by mouth daily before breakfast. Reported on Apr 06, 2015    Historical Provider, MD  Melatonin 3 MG TABS Take 1 tablet (3 mg total) by mouth at bedtime. Patient not taking: Reported on Apr 06, 2015 06/10/15   Donalee Citrininah C Ngetich, NP  oxyCODONE-acetaminophen (PERCOCET/ROXICET) 5-325 MG tablet Take 1 tablet by mouth every 6 (six) hours as needed for moderate pain. Patient not taking: Reported on Apr 06, 2015 06/10/15   Kimber RelicArthur G Green, MD  pantoprazole (PROTONIX) 40 MG tablet Take  40 mg by mouth 2 (two) times daily. Reported on Apr 06, 2015    Historical Provider, MD  polyethylene glycol (MIRALAX / GLYCOLAX) packet Take 17 g by mouth 2 (two) times daily. Patient not taking: Reported on Apr 06, 2015 05/21/15   Altamese DillingVaibhavkumar Vachhani, MD  psyllium (REGULOID) 0.52 g capsule Take 0.52 g by mouth daily. Reported on Apr 06, 2015    Historical Provider, MD  saccharomyces boulardii (FLORASTOR) 250 MG capsule Take 250 mg by mouth 2 (two) times daily. Reported on Apr 06, 2015    Historical Provider, MD  senna-docusate (SENOKOT-S) 8.6-50 MG tablet Take 2 tablets by mouth daily. Reported on Apr 06, 2015    Historical Provider, MD  sertraline (ZOLOFT) 50 MG tablet Take 0.5 tablets (25 mg total) by mouth daily. Patient not taking: Reported on Apr 06, 2015 06/10/15   Dinah C Ngetich,  NP  sucralfate (CARAFATE) 1 g tablet Take 1 g by mouth 4 (four) times daily -  with meals and at bedtime. Reported on Jun 28, 2015    Historical Provider, MD  tamsulosin (FLOMAX) 0.4 MG CAPS capsule Take 0.4 mg by mouth daily after breakfast. Reported on 06/28/15    Historical Provider, MD   Physical Exam: Filed Vitals:   06/28/2015 2257 2015-06-28 2258 06/28/2015 2355 06-28-15 2355  BP: 142/99 142/99  132/113  Pulse: 82 83 84 84  Resp: SpO2: 93% 94%  91%    Wt Readings from Last 3 Encounters:  06/15/15 126.1 kg (278 lb)  06/11/15 126.1 kg (278 lb)  06/10/15 126.1 kg (278 lb)    General:  Sedated. Appears calm and comfortable Eyes: PERRL, normal lids, irises & conjunctiva ENT: Lips & tongue are mildly dry Neck: no LAD, masses or thyromegaly Cardiovascular: RRR, no m/r/g. No LE edema. Telemetry: SR, no arrhythmias  Respiratory: CTA bilaterally, no w/r/r on anterior exam. Normal respiratory effort. Abdomen: Obese, soft, ntnd Skin: no rash or induration seen on limited exam Musculoskeletal: Decreased muscle tone due to sedation. Psychiatric: Sedated and nonverbal. Neurologic: Sedated.           Assessment/Plan Principal Problem:   Intractable pain Continue morphine infusion. The patient still restless at times, so will add low-dose lorazepam. Social services evaluation in a.m.  Active Problems:    Acquired hypothyroidism   Essential (primary) hypertension   Gout   Morbid obesity (HCC)   Psoriasis   Type 2 diabetes mellitus (HCC)  The family has declined further lab work, diagnostic studies or non-palliative/comfort care treatment since they state that they're following the patient's end-of-life wishes.    Code Status: DO NOT RESUSCITATE/DO NOT INTUBATE DVT Prophylaxis: Relatives have declined any medications that are not for comfort measures. Family Communication: His daughter and grandson were present in the room. Disposition Plan: Admit for pain management and palliative measures.  Time spent: About 50 minutes were spent in the process of this admission.  Bobette Mo, M.D. [facility] Pager [phone].

## 2015-06-17 NOTE — Progress Notes (Signed)
Pt seen and examined, 83/M from SNF with DM, CKD, Anemia recently at SNF now with Failure to thrive for several months, much worse in last 2months and severe Back pain. Family declined diagnostic tests or procedures and after discussion with EDP and Admitting MD, he was admitted for End of Care care and started on Morphine gtt Appears comfortable, minimally responsive  Continue current comfort care  Zannie CovePreetha Sarafina Puthoff, MD [phone]

## 2015-06-17 NOTE — Progress Notes (Signed)
   06/17/15 1100  Clinical Encounter Type  Visited With Patient;Patient not available  Visit Type Initial;Psychological support;Spiritual support  Referral From Nurse  Consult/Referral To Chaplain  Spiritual Encounters  Spiritual Needs Emotional;Other (Comment) (Pastoral Support)  Stress Factors  Patient Stress Factors Not reviewed   I visited the patient due to a consult that was put in by the nurse.  The patient was asleep upon my arrival and I was not able to wake him with verbal introduction.  I will follow up with the patient at a later time. Please contact Spiritual Care for further assistance.   Chaplain Clint BolderBrittany Kortnee Bas M.Div.

## 2015-06-17 NOTE — Progress Notes (Addendum)
CSW received referral that pt admitted [facility]. Pt was being [facility].   Per chart review and progression meeting, pt has transitioned to comfort care.   CSW to await further guidance on how to assist with disposition if appropriate.  Loletta SpecterSuzanna Brodin Gelpi, MSW, LCSW Clinical Social Work [phone]

## 2015-06-17 NOTE — Progress Notes (Signed)
[facility] RN Visit  This is a related, covered GIP admission from 06/17/15 related to [facility] diagnosis of End Stage Renal Disease, per Dr. Sonia BallerMazzochi.  Patient is a DNR.  [facility]  EMS was activated at the facility, due to the patient c/o severe pain and they were unable to obtain his prescribed liquid oxycodone and po ativan. Patient was admitted for comfort measures and EOL care.  Patient seen in room with no family at bedside.  He is unresponsive to verbal or tactile stimuli.  He is currently on a Morphine gtt at the rate of 4 mg/hr.  His respirations are WNL.  He is on 2L [place] with O2 sats at 96%.  There is minimal dark, yellow urine noted in foley drainage bag. Patient has required one, 2 mg Morphine bolus for severe pain overnight.  Patient has had no PO intake.  [facility] at the facility with a drip.  Updated [facility] medication list and transfer summary placed on chart.  Please call [facility] with any hospice-related questions or concerns.  Thank you, Hessie KnowsStacie Wilkinson RN, [facility] Hospital Liaison [phone]

## 2015-06-17 NOTE — ED Notes (Signed)
Increased morphine dose to 532ml/hr.  Patient continues to moan in pain and is fidgety.

## 2015-06-17 NOTE — Progress Notes (Signed)
Nutrition Brief Note  Chart reviewed. Pt now transitioning to comfort care.  No further nutrition interventions warranted at this time.  Please re-consult as needed.   Cayce Paschal M. Iveliz Garay, MS, RD LDN After Hours/Weekend Pager [phone]    

## 2015-06-17 NOTE — ED Notes (Signed)
Report called to 3W, RN. 

## 2015-06-18 ENCOUNTER — Other Ambulatory Visit: Payer: Self-pay | Admitting: *Deleted

## 2015-06-18 DIAGNOSIS — R627 Adult failure to thrive: Secondary | ICD-10-CM | POA: Diagnosis present

## 2015-06-18 DIAGNOSIS — I10 Essential (primary) hypertension: Secondary | ICD-10-CM

## 2015-06-18 MED ORDER — GLYCOPYRROLATE 0.2 MG/ML IJ SOLN
0.1000 mg | INTRAMUSCULAR | Status: DC | PRN
  Administered 2015-06-18: 0.1 mg via INTRAVENOUS
  Filled 2015-06-18 (×3): qty 0.5

## 2015-06-18 MED ORDER — LORAZEPAM 2 MG/ML IJ SOLN
1.0000 mg | INTRAMUSCULAR | Status: DC | PRN
  Administered 2015-06-18: 1 mg via INTRAVENOUS
  Filled 2015-06-18: qty 1

## 2015-06-18 MED ORDER — OXYCODONE HCL 20 MG/ML PO CONC: 5.0000 mg | Freq: Three times a day (TID) | ORAL | Status: AC | PRN

## 2015-06-29 NOTE — Progress Notes (Signed)
[facility]-G[facility] RN Visit  This is a related, covered GIP admission from 06/17/15 related to [facility] diagnosis of End Stage Renal Disease, per Dr. Sonia BallerMazzochi. Patient is a DNR. Patient seen in room with daughter at bedside. Patient is unresponsive to verbal or tactile stimuli. He is currently on a Morphine gtt at the rate of 4 mg/hr. His respirations appear shallow. He is on 2L [place] with O2 sats at 97%. There is minimal dark, yellow urine noted in foley drainage bag. Patient has had one dose of 0.5 mg Ativan and one dose of 1 mg Ativan in the past 24 hours for agitation. Patient has had no PO intake. Daughter verbalized gratefulness that the hospital staff is taking such great care of her father.  She verbalized relief that he appears comfortable.  She denied any questions or concerns.  Left [facility] contact information if any needs arise and to offer emotional support.    Please call [facility] with any hospice-related questions or concerns.  In the event of a hospital death, please contact [facility] at [phone].  Thank you, Hessie KnowsStacie Wilkinson RN, [facility] Hospital Liaison [phone]

## 2015-06-29 NOTE — Progress Notes (Addendum)
PROGRESS NOTE    Juan Walker  EAV:[id number] DOB: Jun 01, 1931 DOA: 06/25/2015 PCP: Marisue Ivan, MD  Outpatient Specialists:  Brief Narrative: Juan Walker is a 82 y.o. male with DM, HTN, CKD, Dementia, Failure to thrive, followed by hospice was brought by EMS to the emergency department, accompanied by his daughter and grandson due to intractable back pain. Per family members, the patient has been slowly declining on his mental capacity in the last few months, but over the last 2 weeks he has had a significant decline. Pt nonverbal, has not been eating or drinking any significant amounts of food or liquids in recent days. Family members do not want any diagnostic tests or procedures. They stated that the patient's wishes before he lost the capacity to make decisions, were to be treated specifically with comfort and palliative care measures.   Assessment & Plan:     Adult Failure to thrive   Type 2 diabetes mellitus (HCC)    AKi on CKD 3   Intractable pain   Hypothyroidism   Essential (primary) hypertension   Gout   Morbid obesity (HCC)   Psoriasis  -Continue Full Comfort care -Remains unresponsive on morphine gtt -Bp fluctuating  -resp shallow and expect Hospital demise  DVT prophylaxis: None Code Status: DNR Family Communication:No family at bedside Disposition Plan: expect hospital demise   Consultants:   Procedures:   Subjective: Remains unresponsive  Objective: Filed Vitals:   06/17/15 0251 06/17/15 0403 06/17/15 1451 07-09-15 0518  BP: 98/77 107/85 78/31 97/32   Pulse: 83 84 70 76  Temp:  97.7 F (36.5 C) 97.6 F (36.4 C) 99.4 F (37.4 C)  TempSrc:  Axillary Axillary Axillary  Resp: Height:   (1.651 m)    Weight:  93.985 kg (207 lb 3.2 oz)    SpO2: 95% 96% 95% 97%    Intake/Output Summary (Last 24 hours) at 07-09-15 1131 Last data filed at 2015/07/09 0518  Gross per 24 hour  Intake     32 ml  Output    100 ml  Net    -68 ml    Filed Weights   06/17/15 0403  Weight: 93.985 kg (207 lb 3.2 oz)    Examination:  General exam: unresponsive, comfortable appearing Respiratory system: conducted upper airway sounds Cardiovascular system: S1 & S2 heard, RRR. Gastrointestinal system: Abdomen is nondistended, soft and nontender. No organomegaly or masses felt. Normal bowel sounds  Extremities: no edema Skin: No rashes, lesions or ulcers    Data Reviewed: I have personally reviewed following labs and imaging studies  CBC: No results for input(s): WBC, NEUTROABS, HGB, HCT, MCV, PLT in the last 168 hours. Basic Metabolic Panel: No results for input(s): NA, K, CL, CO2, GLUCOSE, BUN, CREATININE, CALCIUM, MG, PHOS in the last 168 hours. GFR: Estimated Creatinine Clearance: 30.5 mL/min (by C-G formula based on Cr of 2). Liver Function Tests: No results for input(s): AST, ALT, ALKPHOS, BILITOT, PROT, ALBUMIN in the last 168 hours. No results for input(s): LIPASE, AMYLASE in the last 168 hours. No results for input(s): AMMONIA in the last 168 hours. Coagulation Profile: No results for input(s): INR, PROTIME in the last 168 hours. Cardiac Enzymes: No results for input(s): CKTOTAL, CKMB, CKMBINDEX, TROPONINI in the last 168 hours. BNP (last 3 results) No results for input(s): PROBNP in the last 8760 hours. HbA1C: No results for input(s): HGBA1C in the last 72 hours. CBG: No results for input(s): GLUCAP in the last 168  hours. Lipid Profile: No results for input(s): CHOL, HDL, LDLCALC, TRIG, CHOLHDL, LDLDIRECT in the last 72 hours. Thyroid Function Tests: No results for input(s): TSH, T4TOTAL, FREET4, T3FREE, THYROIDAB in the last 72 hours. Anemia Panel: No results for input(s): VITAMINB12, FOLATE, FERRITIN, TIBC, IRON, RETICCTPCT in the last 72 hours. Urine analysis:    Component Value Date/Time   COLORURINE YELLOW* 04/28/2015 1011   COLORURINE Straw 08/02/2011 0444   APPEARANCEUR HAZY* 04/28/2015 1011    APPEARANCEUR Clear 03/30/2015 0938   APPEARANCEUR Clear 08/02/2011 0444   LABSPEC 1.046* 04/28/2015 1011   LABSPEC 1.008 08/02/2011 0444   PHURINE 6.0 04/28/2015 1011   PHURINE 7.0 08/02/2011 0444   GLUCOSEU NEGATIVE 04/28/2015 1011   GLUCOSEU Negative 08/02/2011 0444   HGBUR 1+* 04/28/2015 1011   HGBUR Negative 08/02/2011 0444   BILIRUBINUR NEGATIVE 04/28/2015 1011   BILIRUBINUR Negative 03/30/2015 0938   BILIRUBINUR Negative 08/02/2011 0444   KETONESUR NEGATIVE 04/28/2015 1011   KETONESUR Negative 08/02/2011 0444   PROTEINUR 30* 04/28/2015 1011   PROTEINUR 2+* 03/30/2015 0938   PROTEINUR 100 mg/dL 16/10/960406/05/2011 [id number]   NITRITE NEGATIVE 04/28/2015 1011   NITRITE Negative 03/30/2015 0938   NITRITE Negative 08/02/2011 0444   LEUKOCYTESUR 2+* 04/28/2015 1011   LEUKOCYTESUR Negative 03/30/2015 0938   LEUKOCYTESUR Negative 08/02/2011 0444   Sepsis Labs: @LABRCNTIP (procalcitonin:4,lacticidven:4)  ) Recent Results (from the past 240 hour(s))  MRSA PCR Screening     Status: None   Collection Time: 06/17/15  4:03 AM  Result Value Ref Range Status   MRSA by PCR NEGATIVE NEGATIVE Final    Comment:        The GeneXpert MRSA Assay (FDA approved for NASAL specimens only), is one component of a comprehensive MRSA colonization surveillance program. It is not intended to diagnose MRSA infection nor to guide or monitor treatment for MRSA infections.          Radiology Studies: No results found.      Scheduled Meds: . antiseptic oral rinse  7 mL Mouth Rinse q12n4p  . chlorhexidine  15 mL Mouth Rinse BID   Continuous Infusions: . sodium chloride    . morphine 4 mg/hr (06/17/15 0503)     LOS: 1 day    Time spent: 35min    Zannie CovePreetha Cissy Galbreath, MD [facility] Pager [phone]  If 7PM-7AM, please contact night-coverage [email or web address] Password [email or web address] 11/26/15, 11:31 AM

## 2015-06-29 NOTE — Progress Notes (Signed)
   06/12/2015 1100  Clinical Encounter Type  Visited With Family  Visit Type Initial;Spiritual support;Psychological support  Referral From Nurse  Consult/Referral To Chaplain  Spiritual Encounters  Spiritual Needs Emotional;Other (Comment) (Pastoral Conversation/Support)  Stress Factors  Patient Stress Factors Not reviewed  Family Stress Factors Health changes;Major life changes   I followed up with the patient and had an initial encounter with the patient's daughter who was at the bedside. The patient was asleep when I arrived and did not arouse.  The patient's daughter stated that the family is at peace with the decisions they have made to make sure that the patient is comfortable.  She is realistic about the patient's condition.  The patient's daughter states that she has good support. I will continue to follow-up with this patient and his family.  Chaplain Clint BolderBrittany Albie Bazin M.Div.

## 2015-06-29 NOTE — Discharge Summary (Signed)
Death Summary  Juan Walker [id number] DOB: 25-Feb-1932 DOA: May 07, 2015  PCP: Marisue IvanLINTHAVONG, KANHKA, MD  Admit date: May 07, 2015 Date of Death: 06/07/2015  Final Diagnoses:  Principal Problem:   Intractable pain Active Problems:   Acquired hypothyroidism   Essential (primary) hypertension   Gout   Morbid obesity (HCC)   Psoriasis   Type 2 diabetes mellitus (HCC)   Failure to thrive in adult    History of present illness:  Juan Walker is a 82 y.o. male with DM, HTN, CKD, Dementia, Failure to thrive, followed by hospice was brought by EMS to the emergency department, accompanied by his daughter and grandson due to intractable back pain. Per family members, the patient has been slowly declining on his mental capacity in the last few months, but over the last 2 weeks he has had a significant decline. Pt nonverbal, has not been eating or drinking any significant amounts of food or liquids in recent days  Hospital Course:    Adult Failure to thrive -Per family members, the patient has been slowly declining on his mental capacity in the last few months, but over the last 2 weeks he has had a significant decline. Pt nonverbal, has not been eating or drinking any significant amounts of food or liquids in recent days. Family members did not want any diagnostic tests or procedures. They stated that the patient's wishes before he lost the capacity to make decisions, were to be treated specifically with comfort and palliative care measures. -was started on morphine gtt from the ER itself and expired subsequently on 4/20   Type 2 diabetes mellitus (HCC)  AKi on CKD 3  Intractable pain  Hypothyroidism  Essential (primary) hypertension  Gout  Morbid obesity (HCC)  Psoriasis   Time: 35min  Signed:  Khaniya Tenaglia  [facility] 06/21/2015, 5:23 PM

## 2015-06-29 DEATH — deceased

## 2015-08-30 NOTE — Progress Notes (Signed)
Patient ID: Juan Walker, male   DOB: 25-Jul-1931, 82 y.o.   MRN: [id number]  Location:  [facility]   Place of Service:  SNF (31) Provider:Dinah Ngetich FNP-C   Marisue IvanLINTHAVONG, KANHKA, MD  Patient Care Team: Marisue IvanKanhka Linthavong, MD as PCP - General (Family Medicine)  Extended Emergency Contact Information Primary Emergency Contact: Swiggett,Cynthia R Address: [address]          [place], [place] [address] [place] Mobile Phone: [phone] Relation: Daughter  Code Status: Full Code  Goals of care: Advanced Directive information Advanced Directives 06/17/2015  Does patient have an advance directive? Yes  Type of Advance Directive Living will  Does patient want to make changes to advanced directive? No - Patient declined  Copy of advanced directive(s) in chart? No - copy requested     Chief Complaint  Patient presents with  . Acute Visit    HPI:  Pt is a 82 y.o. male seen today at [facility]  for an acute visit for abnormal lab results. He has a medical history of Type 2 DM, HTN, Hypothyroidism, Depression, BPH, CKD among others. He continues to complain of lower back pain. Facility staff reports patient continues to have poor oral intake. Recent Labs showed Hgb 8.4, CR 3.41 (06/12/2015)previous CR 3.15. Patient refuses to be referred to the ER for evaluation. Patient's daughter who's POA changed patient's code status to DNR.Patient agrees with plan. Facility Building control surveyorurse supervisor in during meeting with patient's daughter. Patient denies any fever, chills or cough.   Past Medical History  Diagnosis Date  . Essential (primary) hypertension 02/16/2015  . Diverticular disease of large intestine 02/16/2015  . Acquired hypothyroidism 02/16/2015  . Diabetes mellitus (HCC) 02/16/2015    Overview:  insulin 2005 last eye exam 01/2013 [facility]   . Depression   . Glaucoma   . Gout   . Renal disorder   . BPH (benign prostatic  hyperplasia)   . BPH (benign prostatic hypertrophy)   . Cataract     Right eye  . Anemia     chronic  . Diverticulosis of colon without hemorrhage   . Gout   . History of prostatitis   . Morbid obesity (HCC)   . Psoriasis   . Shingles   . Renal insufficiency    Past Surgical History  Procedure Laterality Date  . Nephrolithotomy  1972  . Sinus irrigation    . Excision benign skin lesion ears    . Kidney stones removed    . Esophagogastroduodenoscopy (egd) with propofol N/A 04/27/2015    Procedure: ESOPHAGOGASTRODUODENOSCOPY (EGD) WITH PROPOFOL;  Surgeon: Scot Junobert T Elliott, MD;  [facility];  Service: Endoscopy;  Laterality: N/A;    Allergies  Allergen Reactions  . Aspirin Nausea And Vomiting and Other (See Comments)    Reaction:  Constipation  . Metformin Nausea And Vomiting and Other (See Comments)    Pt states that this medication makes his blood clot.    . Pioglitazone Other (See Comments)    Reaction:  Left-sided weakness  . Rosiglitazone Other (See Comments)    Reaction:  Left-sided weakness      Medication List       This list is accurate as of: 06/15/15 11:59 PM.  Always use your most recent med list.               amLODipine 5 MG tablet  Commonly known as:  NORVASC  Take 5  mg by mouth daily. Reported on 06/13/2015     b complex vitamins tablet  Take 1 tablet by mouth daily. Reported on 06/22/2015     carvedilol 3.125 MG tablet  Commonly known as:  COREG  Take 1 tablet (3.125 mg total) by mouth 2 (two) times daily with a meal.     docusate sodium 100 MG capsule  Commonly known as:  COLACE  Take 100 mg by mouth 2 (two) times daily. Reported on 06/04/2015     ferrous sulfate 325 (65 FE) MG EC tablet  Take 1 tablet (325 mg total) by mouth 3 (three) times daily with meals.     gi cocktail Susp suspension  Take 5 mLs by mouth every 4 (four) hours as needed for indigestion. Reported on 06/28/2015     insulin lispro 100 UNIT/ML injection    Commonly known as:  HUMALOG  Inject into the skin 3 (three) times daily before meals. Reported on 06/06/2015     lactulose 10 GM/15ML solution  Commonly known as:  CHRONULAC  Take 30 g by mouth daily as needed for mild constipation. Reported on 06/26/2015     levofloxacin 750 MG tablet  Commonly known as:  LEVAQUIN  Take 1 tablet (750 mg total) by mouth every other day.     levothyroxine 50 MCG tablet  Commonly known as:  SYNTHROID, LEVOTHROID  Take 50 mcg by mouth daily before breakfast. Reported on 06/19/2015     Melatonin 3 MG Tabs  Take 1 tablet (3 mg total) by mouth at bedtime.     oxyCODONE-acetaminophen 5-325 MG tablet  Commonly known as:  PERCOCET/ROXICET  Take 1 tablet by mouth every 6 (six) hours as needed for moderate pain.     pantoprazole 40 MG tablet  Commonly known as:  PROTONIX  Take 40 mg by mouth 2 (two) times daily. Reported on 06/28/2015     polyethylene glycol packet  Commonly known as:  MIRALAX / GLYCOLAX  Take 17 g by mouth 2 (two) times daily.     psyllium 0.52 g capsule  Commonly known as:  REGULOID  Take 0.52 g by mouth daily. Reported on 06/17/2015     saccharomyces boulardii 250 MG capsule  Commonly known as:  FLORASTOR  Take 250 mg by mouth 2 (two) times daily. Reported on 06/08/2015     senna-docusate 8.6-50 MG tablet  Commonly known as:  Senokot-S  Take 2 tablets by mouth daily. Reported on 06/08/2015     sertraline 50 MG tablet  Commonly known as:  ZOLOFT  Take 0.5 tablets (25 mg total) by mouth daily.     sucralfate 1 g tablet  Commonly known as:  CARAFATE  Take 1 g by mouth 4 (four) times daily -  with meals and at bedtime. Reported on 06/17/2015     tamsulosin 0.4 MG Caps capsule  Commonly known as:  FLOMAX  Take 0.4 mg by mouth daily after breakfast. Reported on 06/13/2015        Review of Systems  Constitutional: Positive for activity change and appetite change. Negative for fever, chills and fatigue.  HENT: Negative for  congestion, rhinorrhea, sinus pressure, sneezing and sore throat.   Eyes: Negative.   Respiratory: Negative.   Cardiovascular: Negative.   Gastrointestinal: Negative for nausea, vomiting, abdominal pain, diarrhea, constipation and abdominal distention.  Endocrine: Negative.   Genitourinary: Negative.   Musculoskeletal: Positive for back pain and gait problem.  Skin: Negative.   Neurological: Negative for dizziness, seizures,  syncope, light-headedness and headaches.  Psychiatric/Behavioral: Negative for suicidal ideas, hallucinations, behavioral problems, confusion, sleep disturbance, self-injury and agitation.    Immunization History  Administered Date(s) Administered  . PPD Test 06/05/2015   There are no preventive care reminders to display for this patient. No flowsheet data found. Functional Status Survey:    Filed Vitals:   06/15/15 1733  BP: 119/74  Pulse: 76  Temp: 98.6 F (37 C)  Resp: 20  Height: 5\' 6"  (1.676 m)  Weight: 278 lb (126.1 kg)  SpO2: 96%   Body mass index is 44.89 kg/(m^2). Physical Exam  Constitutional: He appears well-developed and well-nourished. No distress.  Obese   HENT:  Head: Normocephalic.  Mouth/Throat: Oropharynx is clear and moist.  Eyes: Conjunctivae and EOM are normal. Pupils are equal, round, and reactive to light. Right eye exhibits no discharge. Left eye exhibits no discharge. No scleral icterus.  Neck: Normal range of motion. No JVD present.  Cardiovascular: Normal rate, regular rhythm, normal heart sounds and intact distal pulses.  Exam reveals no gallop and no friction rub.   No murmur heard. Pulmonary/Chest: Effort normal and breath sounds normal. No respiratory distress. He has no wheezes. He has no rales.  Abdominal: Soft. Bowel sounds are normal. He exhibits no distension. There is no tenderness. There is no rebound and no guarding.  Musculoskeletal: He exhibits no edema or tenderness.  Normal ROM except lower back limited due  to pain   Lymphadenopathy:    He has no cervical adenopathy.  Neurological: He is alert.  Skin: Skin is warm and dry. No rash noted. No erythema. No pallor.  Psychiatric: He has a normal mood and affect.    Labs reviewed:  Recent Labs  05/17/15 0534  06/03/15 0125 06/04/15 0540 06/05/15 06/05/15 0932 06/09/15  NA 133*  < > 132* 134* 133* 133* 138  K 4.1  < > 4.2 4.1  --  4.2 4.2  CL 105  < > 104 105  --  104  --   CO2 24  < > 21* 21*  --  20*  --   GLUCOSE 109*  < > 110* 129*  --  163*  --   BUN 27*  < > 30* 32* 35* 35* 57*  CREATININE 1.73*  < > 1.48* 1.44* 1.7* 1.71* 2.0*  CALCIUM 8.1*  < > 8.1* 8.1*  --  8.5*  --   MG 2.1  --   --   --   --   --   --   < > = values in this interval not displayed.  Recent Labs  05/16/15 1112 05/17/15 0534 06/02/15 1449 06/09/15  AST 48* 49* 45*  --   ALT 32 35 33  --   ALKPHOS 101 101 110 188*  BILITOT 0.5 0.6 0.6  --   PROT 7.1 6.2* 6.9  --   ALBUMIN 2.8* 2.4* 2.4*  --     Recent Labs  02/19/15 1337  04/28/15 1011  05/21/15 0929 06/02/15 1449 06/05/15 06/05/15 0932 06/09/15  WBC 6.3  < > 8.1  < > 7.3 8.5 9.4 9.4 9.1  NEUTROABS 3.8  --  6.3  --   --   --   --   --   --   HGB 12.4*  < > 11.3*  < > 10.7* 9.7*  --  9.2* 7.8*  HCT 38.1*  < > 33.3*  < > 32.6* 28.9*  --  27.6* 25*  MCV 95.0  < >  92.4  < > 91.5 89.6  --  89.8  --   PLT 199  < > 277  < > 343 466*  --  508* 427*  < > = values in this interval not displayed. Lab Results  Component Value Date   TSH 4.064 06/04/2015   Lab Results  Component Value Date   HGBA1C 7.0 06/09/2015   Lab Results  Component Value Date   CHOL 139 06/09/2015   HDL 37 06/09/2015   LDLCALC 87 06/09/2015   TRIG 71 06/09/2015    Significant Diagnostic Results in last 30 days:  No results found.  Assessment/Plan 1. Chronic anemia Recent Hgb 8.4 secondary to chronic Kidney disease. Continue on Ferrous sulfate. Monitor CBC  2. Acute renal failure superimposed on stage 3 chronic  kidney disease (HCC) Recent CR 3.41 worsening possible due to poor oral intake. Patient refused referral to ER and refused IVF infusion in the facility. Discussed patient's condition with Facility Nurse supervisor and patient's daughter who's the  POA decided to switch patient's code status to DNR. Will also consult Hospice service for comfort care.   3. DNR (do not resuscitate) discussion Has had poor oral intake and Worsening CKD. Refused referral to ER.Discussed patient's condition with Facility Nurse supervisor and patient's daughter who's the  POA decided to switch patient's code status to DNR. Will also consult Hospice service for comfort care. DNR form filled during visit.   4. Back Pain  Chronic lower back pain. Continue current pain regimen. Consult with Hospice for comfort care.     Family/ staff Communication: Reviewed plan of care with patient, Patient's daughter and facility Nurse supervisor  Labs/tests ordered:  None   Spent > 40 minutes counseling and discussing plan of care with patient's POA, patient, Facility Nurse supervisor; reviewing medical record; tests; labs; and developing future plan of care.

## 2016-07-27 IMAGING — DX DG CHEST 1V PORT
1 series · 1 of 1 positions shown · non-contrast
Comparison: 05/16/2015

CLINICAL DATA: Chest pain for 2 weeks

EXAM:
PORTABLE CHEST 1 VIEW

[chest ap]
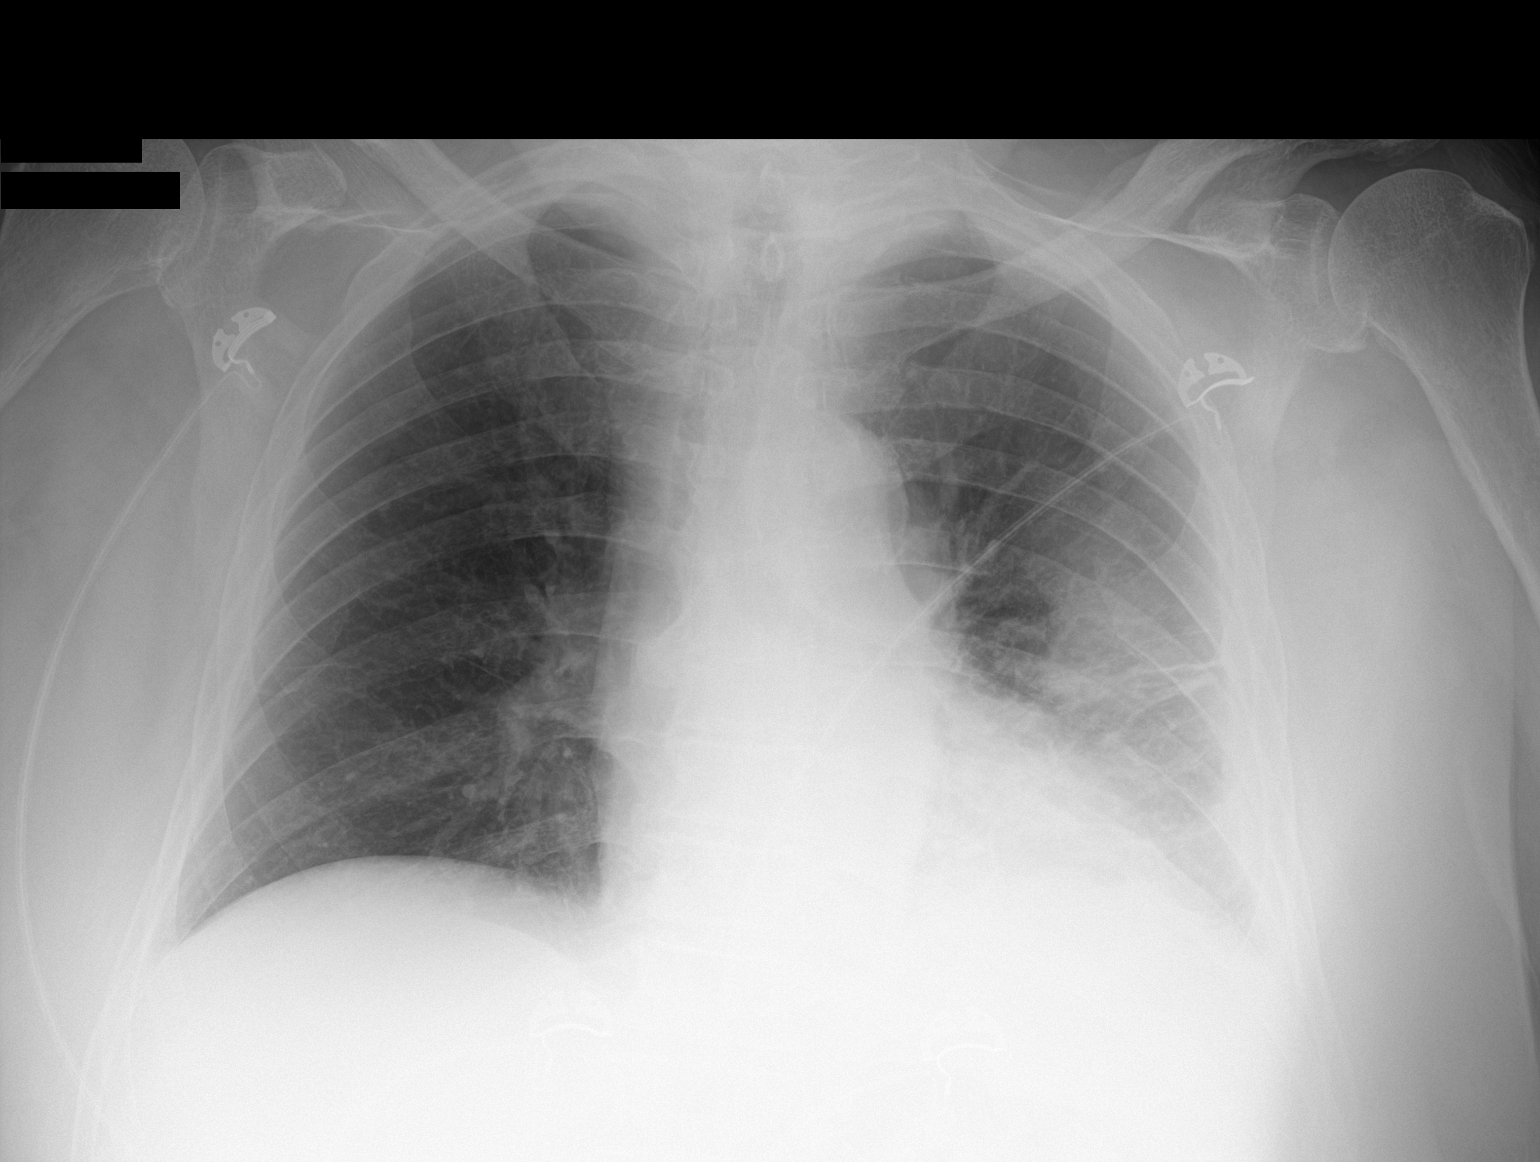

[1 of 1 positions shown; findings below may reference images not displayed]

FINDINGS: Heterogeneous opacities in the left mid and lower lung zone have
developed. Left pleural effusion is suspected. Right lung is clear.
Normal heart size.
IMPRESSION: Opacities in the left lung have developed with a suspected pleural
effusion worrisome for pneumonia. Followup PA and lateral chest
X-ray is recommended in 3-4 weeks following trial of antibiotic
therapy to ensure resolution and exclude underlying malignancy.

## 2016-07-28 IMAGING — CT CT HEAD W/O CM
1 series · 16 of 30 positions shown, 20 images · non-contrast
Comparison: 05/16/2015

CLINICAL DATA: Headaches off and on for 2 days

EXAM:
CT HEAD WITHOUT CONTRAST
TECHNIQUE: Contiguous axial images were obtained from the base of the skull
through the vertex without intravenous contrast.

[Series 2: head wo · axial · 0.43mm/px · z∈[-78,+48]mm · 16 of 32 slices shown, 20 images]
[im 2/32  brain]
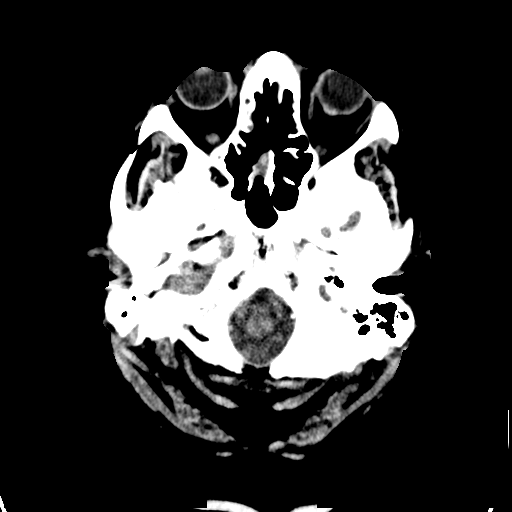
[im 2/32  bone]
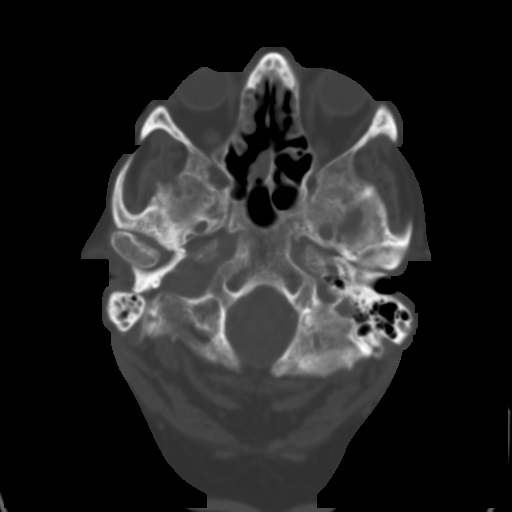
[im 4/32  brain]
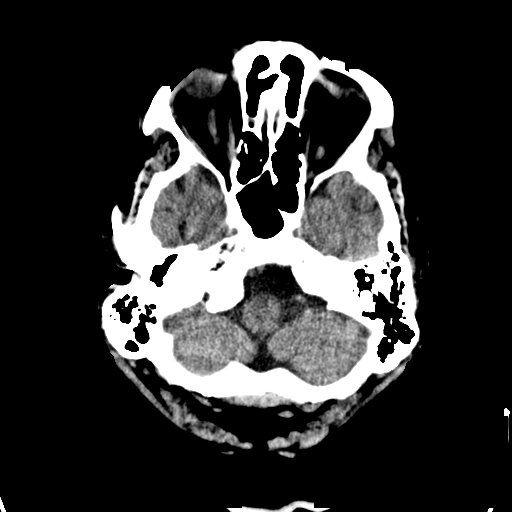
[im 6/32  brain]
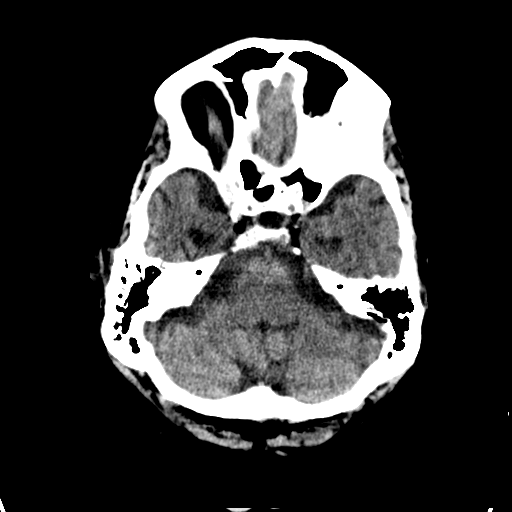
[im 8/32  brain]
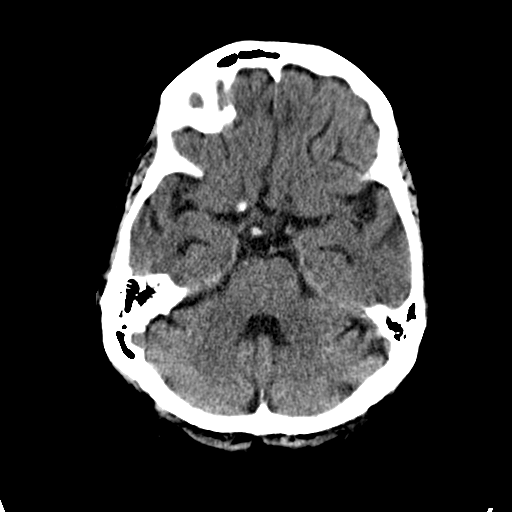
[im 9/32  brain]
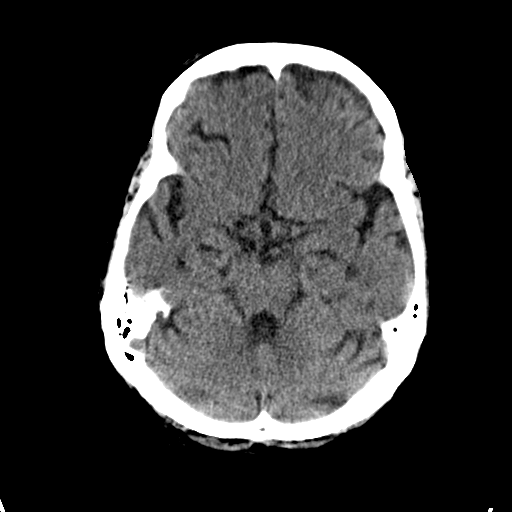
[im 9/32  bone]
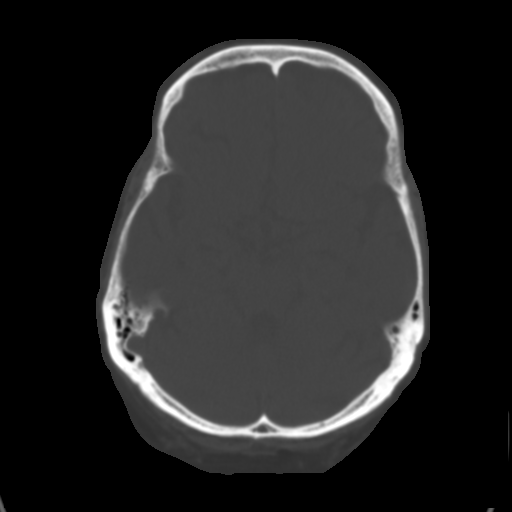
[im 11/32  brain]
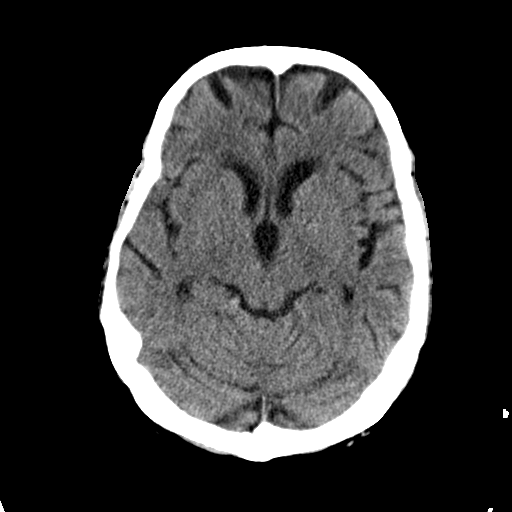
[im 13/32  brain]
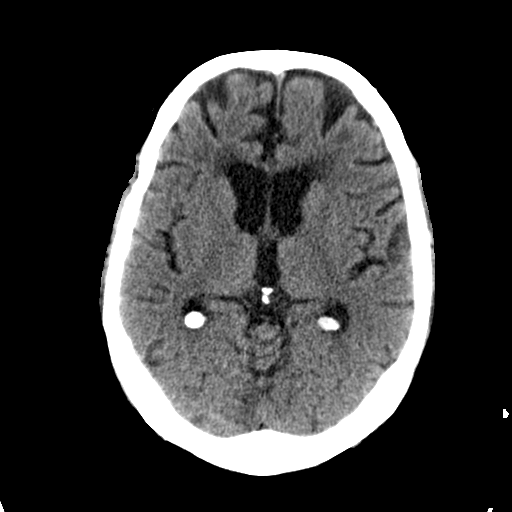
[im 15/32  brain]
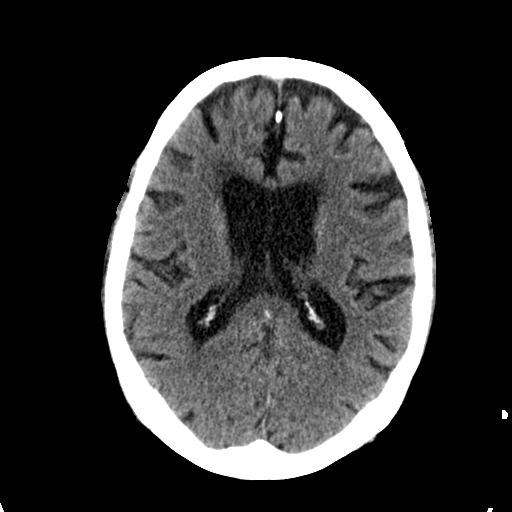
[im 17/32  brain]
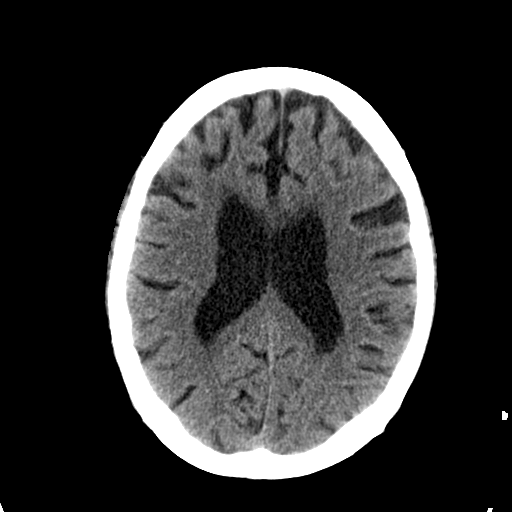
[im 17/32  bone]
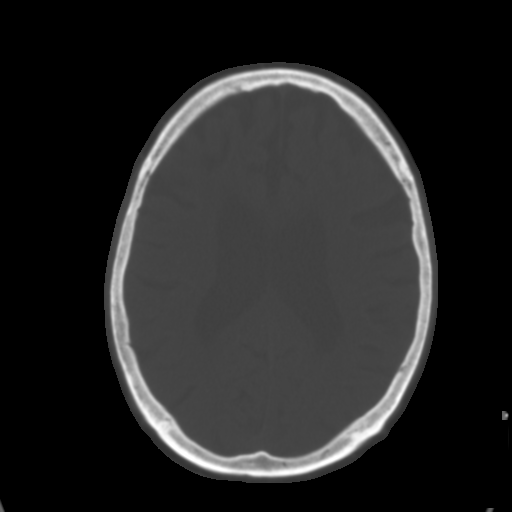
[im 19/32  brain]
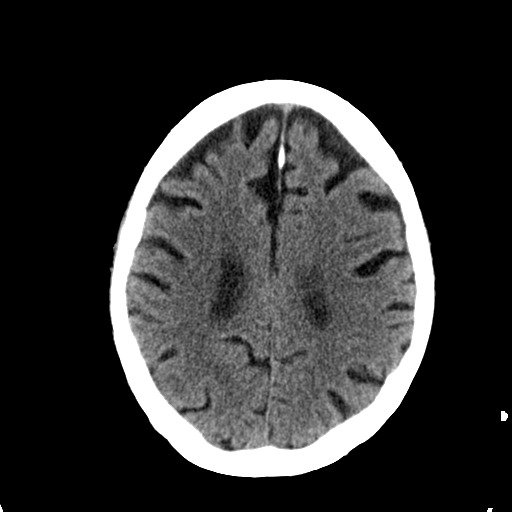
[im 21/32  brain]
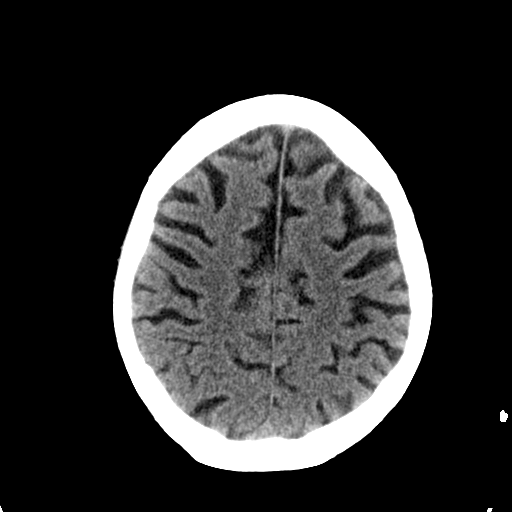
[im 23/32  brain]
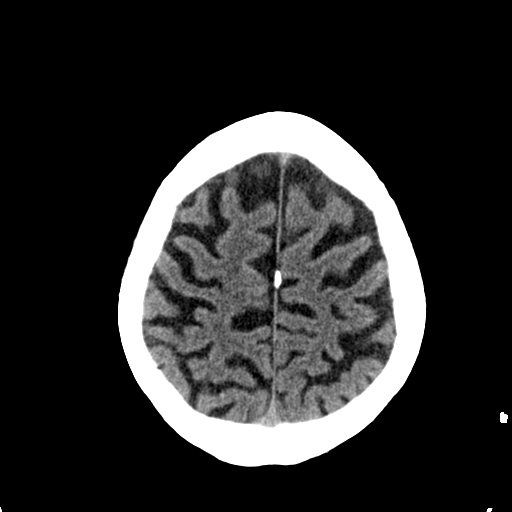
[im 24/32  brain]
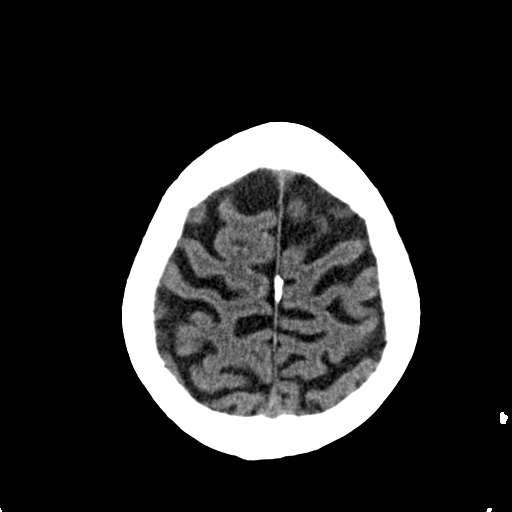
[im 24/32  bone]
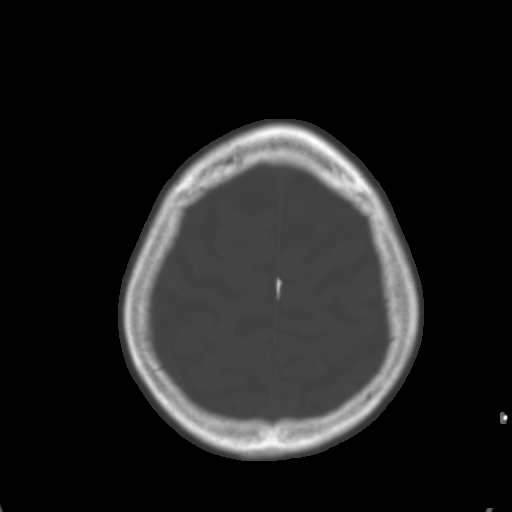
[im 26/32  brain]
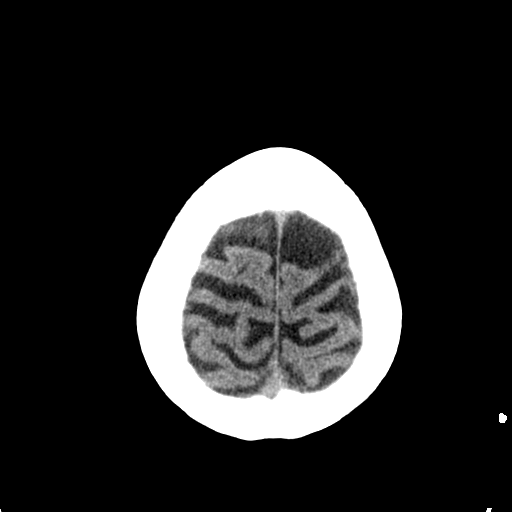
[im 28/32  brain]
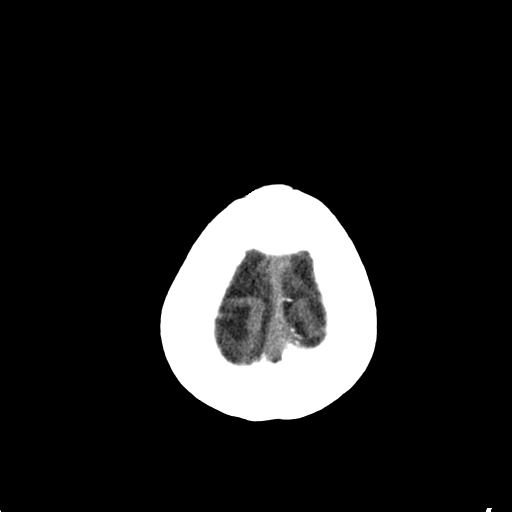
[im 30/32  brain]
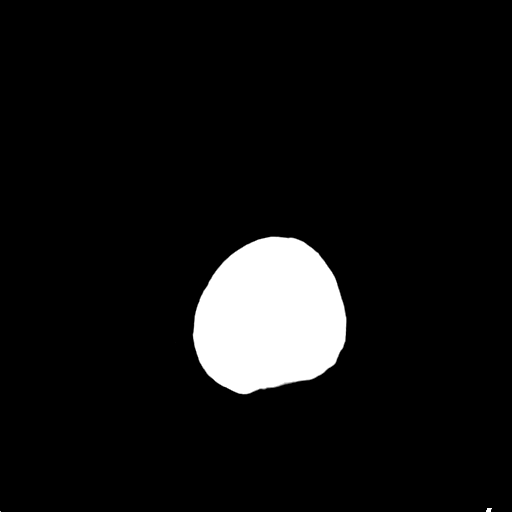

[16 of 30 positions shown; findings below may reference images not displayed]

FINDINGS: The bony calvarium is intact. No gross soft tissue abnormality is
noted. Diffuse atrophic changes and chronic white matter ischemic
change is again identified. No findings to suggest acute hemorrhage,
acute infarction or space-occupying mass lesion are seen.
IMPRESSION: Chronic atrophic and ischemic changes without acute abnormality.
